# Patient Record
Sex: Female | Born: 1935
Health system: Southern US, Community
[De-identification: ages and names within clinical notes are randomized; demographics above are authoritative.]

## PROBLEM LIST (undated history)

## (undated) DIAGNOSIS — E785 Hyperlipidemia, unspecified: Secondary | ICD-10-CM

## (undated) DIAGNOSIS — H409 Unspecified glaucoma: Secondary | ICD-10-CM

## (undated) DIAGNOSIS — E119 Type 2 diabetes mellitus without complications: Secondary | ICD-10-CM

## (undated) DIAGNOSIS — I10 Essential (primary) hypertension: Secondary | ICD-10-CM

## (undated) HISTORY — DX: Type 2 diabetes mellitus without complications: E11.9

## (undated) HISTORY — DX: Unspecified glaucoma: H40.9

## (undated) HISTORY — PX: ABDOMINAL HYSTERECTOMY: SUR658

## (undated) HISTORY — PX: CYST EXCISION: SHX5701

## (undated) HISTORY — DX: Essential (primary) hypertension: I10

## (undated) HISTORY — DX: Hyperlipidemia, unspecified: E78.5

---

## 2014-01-03 DIAGNOSIS — E119 Type 2 diabetes mellitus without complications: Secondary | ICD-10-CM | POA: Diagnosis not present

## 2014-01-03 DIAGNOSIS — I2510 Atherosclerotic heart disease of native coronary artery without angina pectoris: Secondary | ICD-10-CM | POA: Diagnosis not present

## 2014-01-03 DIAGNOSIS — I10 Essential (primary) hypertension: Secondary | ICD-10-CM | POA: Diagnosis not present

## 2014-01-03 DIAGNOSIS — E785 Hyperlipidemia, unspecified: Secondary | ICD-10-CM | POA: Diagnosis not present

## 2014-03-14 DIAGNOSIS — H409 Unspecified glaucoma: Secondary | ICD-10-CM | POA: Diagnosis not present

## 2014-03-14 DIAGNOSIS — Z961 Presence of intraocular lens: Secondary | ICD-10-CM | POA: Diagnosis not present

## 2014-03-14 DIAGNOSIS — H4010X0 Unspecified open-angle glaucoma, stage unspecified: Secondary | ICD-10-CM | POA: Diagnosis not present

## 2014-04-04 DIAGNOSIS — H409 Unspecified glaucoma: Secondary | ICD-10-CM | POA: Diagnosis not present

## 2014-04-04 DIAGNOSIS — H4010X0 Unspecified open-angle glaucoma, stage unspecified: Secondary | ICD-10-CM | POA: Diagnosis not present

## 2014-05-24 DIAGNOSIS — E785 Hyperlipidemia, unspecified: Secondary | ICD-10-CM | POA: Diagnosis not present

## 2014-05-24 DIAGNOSIS — I2510 Atherosclerotic heart disease of native coronary artery without angina pectoris: Secondary | ICD-10-CM | POA: Diagnosis not present

## 2014-05-24 DIAGNOSIS — E119 Type 2 diabetes mellitus without complications: Secondary | ICD-10-CM | POA: Diagnosis not present

## 2014-05-24 DIAGNOSIS — Z23 Encounter for immunization: Secondary | ICD-10-CM | POA: Diagnosis not present

## 2014-05-24 DIAGNOSIS — I10 Essential (primary) hypertension: Secondary | ICD-10-CM | POA: Diagnosis not present

## 2014-06-19 DIAGNOSIS — H409 Unspecified glaucoma: Secondary | ICD-10-CM | POA: Diagnosis not present

## 2014-06-19 DIAGNOSIS — H4010X0 Unspecified open-angle glaucoma, stage unspecified: Secondary | ICD-10-CM | POA: Diagnosis not present

## 2014-06-19 DIAGNOSIS — H26499 Other secondary cataract, unspecified eye: Secondary | ICD-10-CM | POA: Diagnosis not present

## 2014-06-28 DIAGNOSIS — H4010X0 Unspecified open-angle glaucoma, stage unspecified: Secondary | ICD-10-CM | POA: Diagnosis not present

## 2014-06-28 DIAGNOSIS — H409 Unspecified glaucoma: Secondary | ICD-10-CM | POA: Diagnosis not present

## 2014-06-28 DIAGNOSIS — H26499 Other secondary cataract, unspecified eye: Secondary | ICD-10-CM | POA: Diagnosis not present

## 2014-09-12 DIAGNOSIS — I10 Essential (primary) hypertension: Secondary | ICD-10-CM | POA: Diagnosis not present

## 2014-09-12 DIAGNOSIS — E119 Type 2 diabetes mellitus without complications: Secondary | ICD-10-CM | POA: Diagnosis not present

## 2014-09-12 DIAGNOSIS — Z1212 Encounter for screening for malignant neoplasm of rectum: Secondary | ICD-10-CM | POA: Diagnosis not present

## 2014-09-12 DIAGNOSIS — Z23 Encounter for immunization: Secondary | ICD-10-CM | POA: Diagnosis not present

## 2014-09-12 DIAGNOSIS — E785 Hyperlipidemia, unspecified: Secondary | ICD-10-CM | POA: Diagnosis not present

## 2015-08-22 DIAGNOSIS — J209 Acute bronchitis, unspecified: Secondary | ICD-10-CM | POA: Diagnosis not present

## 2015-10-07 DIAGNOSIS — Z23 Encounter for immunization: Secondary | ICD-10-CM | POA: Diagnosis not present

## 2015-10-13 ENCOUNTER — Encounter (HOSPITAL_COMMUNITY): Payer: Self-pay | Admitting: Emergency Medicine

## 2015-10-13 ENCOUNTER — Emergency Department (INDEPENDENT_AMBULATORY_CARE_PROVIDER_SITE_OTHER)
Admission: EM | Admit: 2015-10-13 | Discharge: 2015-10-13 | Disposition: A | Discharge status: Home / Self Care | Payer: Medicare Other | Source: Home / Self Care | Attending: Family Medicine | Admitting: Family Medicine

## 2015-10-13 DIAGNOSIS — I10 Essential (primary) hypertension: Secondary | ICD-10-CM

## 2015-10-13 MED ORDER — ROSUVASTATIN CALCIUM 20 MG PO TABS: 20.0000 mg | ORAL_TABLET | Freq: Every day | ORAL | Status: DC

## 2015-10-13 MED ORDER — METOPROLOL SUCCINATE ER 100 MG PO TB24: 100.0000 mg | ORAL_TABLET | Freq: Every day | ORAL | Status: DC

## 2015-10-13 MED ORDER — LOSARTAN POTASSIUM 100 MG PO TABS: 100.0000 mg | ORAL_TABLET | Freq: Every day | ORAL | Status: DC

## 2015-10-13 MED ORDER — AMLODIPINE BESYLATE 10 MG PO TABS: 10.0000 mg | ORAL_TABLET | Freq: Every day | ORAL | Status: DC

## 2015-10-13 NOTE — ED Provider Notes (Signed)
CSN: [id number]     Arrival date & time 10/13/15  1444 History   First MD Initiated Contact with Patient 10/13/15 1618     Chief Complaint  Patient presents with  . Medication Refill   (Consider location/radiation/quality/duration/timing/severity/associated sxs/prior Treatment) The history is provided by the patient. No language interpreter was used.  Patient presents for refills of her usual maintenance medications for high blood pressure and hypercholesterolemia.  She denies any complaints.  Specifically, she denies chest pain, cough, fever, shortness of breath, or pain. She has some ankle swelling which is unchanged from her baseline according to her history.   She did receive the flu shot earlier this flu season.   She is establishing with Dr. Marjo Bicker in early January, moving to the area from [place] near [place], [place].   History reviewed. No pertinent past medical history. No past surgical history on file. History reviewed. No pertinent family history. Social History  Substance Use Topics  . Smoking status: None  . Smokeless tobacco: None  . Alcohol Use: None   OB History    No data available     Review of Systems  Constitutional: Negative for fever, chills, diaphoresis, activity change, appetite change and fatigue.  Respiratory: Negative for cough, chest tightness and shortness of breath.   Cardiovascular: Negative for chest pain.    Allergies  Penicillins  Home Medications   Prior to Admission medications   Medication Sig Start Date End Date Taking? Authorizing Provider  amLODipine (NORVASC) 10 MG tablet Take 1 tablet (10 mg total) by mouth daily. 10/13/15   Willeen Niece, MD  losartan (COZAAR) 100 MG tablet Take 1 tablet (100 mg total) by mouth daily. 10/13/15   Willeen Niece, MD  metoprolol succinate (TOPROL-XL) 100 MG 24 hr tablet Take 1 tablet (100 mg total) by mouth daily. Take with or immediately following a meal. 10/13/15   Willeen Niece, MD   rosuvastatin (CRESTOR) 20 MG tablet Take 1 tablet (20 mg total) by mouth daily. 10/13/15   Willeen Niece, MD   Meds Ordered and Administered this Visit  Medications - No data to display  BP 157/79 mmHg  Pulse 70  Temp(Src) 98.6 F (37 C) (Oral)  SpO2 96% No data found.   Physical Exam  ED Course  Procedures (including critical care time)  Labs Review Labs Reviewed - No data to display  Imaging Review No results found.   Visual Acuity Review  Right Eye Distance:   Left Eye Distance:   Bilateral Distance:    Right Eye Near:   Left Eye Near:    Bilateral Near:         MDM   1. Hypertension, essential    Essential hypertension, controlled. For refills of medications. Has appointment to establish with new primary doctor on the first week of January 2017.     Willeen Niece, MD 10/13/15 [phone]

## 2015-10-13 NOTE — Discharge Instructions (Signed)
It was a pleasure to meet you today.    I am prescribing your usual medications, with one month of additional refills, to get you to your new doctor's appointment.

## 2015-10-13 NOTE — ED Notes (Signed)
The patient presented to the [facility] with family for multiple medication refills. The patient stated that she was not able to gain a PCP until January 2017.

## 2015-11-13 ENCOUNTER — Other Ambulatory Visit: Payer: Self-pay | Admitting: Internal Medicine

## 2015-11-13 ENCOUNTER — Other Ambulatory Visit: Payer: Medicare Other | Admitting: Internal Medicine

## 2015-11-13 DIAGNOSIS — M8580 Other specified disorders of bone density and structure, unspecified site: Secondary | ICD-10-CM

## 2015-11-13 DIAGNOSIS — R7309 Other abnormal glucose: Secondary | ICD-10-CM | POA: Diagnosis not present

## 2015-11-13 DIAGNOSIS — E785 Hyperlipidemia, unspecified: Secondary | ICD-10-CM

## 2015-11-13 DIAGNOSIS — I10 Essential (primary) hypertension: Secondary | ICD-10-CM

## 2015-11-13 DIAGNOSIS — Z1321 Encounter for screening for nutritional disorder: Secondary | ICD-10-CM

## 2015-11-13 LAB — COMPREHENSIVE METABOLIC PANEL
ALT: 24 U/L (ref 6–29)
AST: 20 U/L (ref 10–35)
Albumin: 4.2 g/dL (ref 3.6–5.1)
Alkaline Phosphatase: 80 U/L (ref 33–130)
BUN: 8 mg/dL (ref 7–25)
CALCIUM: 9.3 mg/dL (ref 8.6–10.4)
CO2: 28 mmol/L (ref 20–31)
Chloride: 101 mmol/L (ref 98–110)
Creat: 0.63 mg/dL (ref 0.60–0.88)
GLUCOSE: 122 mg/dL — AB (ref 65–99)
POTASSIUM: 4.4 mmol/L (ref 3.5–5.3)
Sodium: 137 mmol/L (ref 135–146)
Total Bilirubin: 0.9 mg/dL (ref 0.2–1.2)
Total Protein: 6.6 g/dL (ref 6.1–8.1)

## 2015-11-13 LAB — CBC WITH DIFFERENTIAL/PLATELET
Basophils Absolute: 0.1 10*3/uL (ref 0.0–0.1)
Basophils Relative: 1 % (ref 0–1)
Eosinophils Absolute: 0.2 10*3/uL (ref 0.0–0.7)
Eosinophils Relative: 4 % (ref 0–5)
HEMATOCRIT: 37.1 % (ref 36.0–46.0)
HEMOGLOBIN: 12.3 g/dL (ref 12.0–15.0)
LYMPHS ABS: 1.4 10*3/uL (ref 0.7–4.0)
LYMPHS PCT: 28 % (ref 12–46)
MCH: 30.1 pg (ref 26.0–34.0)
MCHC: 33.2 g/dL (ref 30.0–36.0)
MCV: 90.9 fL (ref 78.0–100.0)
MONO ABS: 0.4 10*3/uL (ref 0.1–1.0)
MPV: 8.4 fL — ABNORMAL LOW (ref 8.6–12.4)
Monocytes Relative: 8 % (ref 3–12)
NEUTROS ABS: 3 10*3/uL (ref 1.7–7.7)
Neutrophils Relative %: 59 % (ref 43–77)
Platelets: 260 10*3/uL (ref 150–400)
RBC: 4.08 MIL/uL (ref 3.87–5.11)
RDW: 12.8 % (ref 11.5–15.5)
WBC: 5.1 10*3/uL (ref 4.0–10.5)

## 2015-11-13 LAB — LIPID PANEL
CHOL/HDL RATIO: 2.8 ratio (ref <=5.0)
CHOLESTEROL: 135 mg/dL (ref 125–200)
HDL: 48 mg/dL (ref >=46)
LDL Cholesterol: 59 mg/dL (ref <130)
TRIGLYCERIDES: 140 mg/dL (ref <150)
VLDL: 28 mg/dL (ref <30)

## 2015-11-13 LAB — TSH: TSH: 0.957 u[IU]/mL (ref 0.350–4.500)

## 2015-11-13 NOTE — Addendum Note (Signed)
Addended by: Despina Hidden on: 11/13/2015 10:21 AM   Modules accepted: Orders

## 2015-11-15 ENCOUNTER — Encounter: Payer: Self-pay | Admitting: Internal Medicine

## 2015-11-15 ENCOUNTER — Other Ambulatory Visit: Payer: Self-pay | Admitting: Internal Medicine

## 2015-11-15 ENCOUNTER — Ambulatory Visit (INDEPENDENT_AMBULATORY_CARE_PROVIDER_SITE_OTHER): Payer: Medicare Other | Admitting: Internal Medicine

## 2015-11-15 VITALS — BP 136/78 | HR 87 | Temp 97.9°F | Resp 20 | Ht 59.5 in | Wt 136.0 lb

## 2015-11-15 DIAGNOSIS — I10 Essential (primary) hypertension: Secondary | ICD-10-CM | POA: Diagnosis not present

## 2015-11-15 DIAGNOSIS — Z0000 Encounter for general adult medical examination without abnormal findings: Secondary | ICD-10-CM

## 2015-11-15 DIAGNOSIS — E119 Type 2 diabetes mellitus without complications: Secondary | ICD-10-CM | POA: Diagnosis not present

## 2015-11-15 DIAGNOSIS — Z8673 Personal history of transient ischemic attack (TIA), and cerebral infarction without residual deficits: Secondary | ICD-10-CM

## 2015-11-15 DIAGNOSIS — E118 Type 2 diabetes mellitus with unspecified complications: Secondary | ICD-10-CM | POA: Diagnosis not present

## 2015-11-15 DIAGNOSIS — H409 Unspecified glaucoma: Secondary | ICD-10-CM | POA: Diagnosis not present

## 2015-11-15 DIAGNOSIS — E785 Hyperlipidemia, unspecified: Secondary | ICD-10-CM | POA: Diagnosis not present

## 2015-11-15 DIAGNOSIS — Z1231 Encounter for screening mammogram for malignant neoplasm of breast: Secondary | ICD-10-CM

## 2015-11-15 LAB — POCT URINALYSIS DIPSTICK
Bilirubin, UA: NEGATIVE
Blood, UA: NEGATIVE
Glucose, UA: NEGATIVE
Ketones, UA: NEGATIVE
Leukocytes, UA: NEGATIVE
Nitrite, UA: NEGATIVE
Protein, UA: NEGATIVE
Spec Grav, UA: 1.01
Urobilinogen, UA: 0.2
pH, UA: 6.5

## 2015-11-16 LAB — HEMOGLOBIN A1C
HEMOGLOBIN A1C: 5.5 % (ref <5.7)
MEAN PLASMA GLUCOSE: 111 mg/dL (ref <117)

## 2015-11-16 LAB — MICROALBUMIN, URINE: MICROALB UR: 1.3 mg/dL

## 2015-11-21 ENCOUNTER — Telehealth: Payer: Self-pay

## 2015-11-21 NOTE — Telephone Encounter (Signed)
Patient referred for diagnostic mammogram: scheduled for 12/03/15 @ 10:50

## 2015-12-03 ENCOUNTER — Ambulatory Visit
Admission: RE | Admit: 2015-12-03 | Discharge: 2015-12-03 | Disposition: A | Discharge status: Home / Self Care | Payer: Medicare Other | Source: Ambulatory Visit | Attending: Internal Medicine | Admitting: Internal Medicine

## 2015-12-03 DIAGNOSIS — Z1231 Encounter for screening mammogram for malignant neoplasm of breast: Secondary | ICD-10-CM | POA: Diagnosis not present

## 2015-12-06 ENCOUNTER — Other Ambulatory Visit: Payer: Self-pay | Admitting: Family Medicine

## 2015-12-06 NOTE — Telephone Encounter (Signed)
Refill x 6 months 

## 2015-12-09 DIAGNOSIS — E785 Hyperlipidemia, unspecified: Secondary | ICD-10-CM | POA: Insufficient documentation

## 2015-12-09 DIAGNOSIS — E119 Type 2 diabetes mellitus without complications: Secondary | ICD-10-CM | POA: Insufficient documentation

## 2015-12-09 DIAGNOSIS — H409 Unspecified glaucoma: Secondary | ICD-10-CM | POA: Insufficient documentation

## 2015-12-09 DIAGNOSIS — I10 Essential (primary) hypertension: Secondary | ICD-10-CM | POA: Insufficient documentation

## 2015-12-09 NOTE — Progress Notes (Signed)
Subjective:    Patient ID: Amber Chang, female    DOB: 08/14/1927, 82 y.o.   MRN: [id number]  HPI First visit for this 82 year old Black Female, mother of [name] who has moved here recently to reside with [name]. Patient previously saw Dr. Ladell Heads in [place], [place] for primary care. Mrs. Luttrell previously resided in [place], [place] the [place] in [place].  Medical issues include essential hypertension, controlled height 2 diabetes mellitus, hyperlipidemia. History of allergic rhinitis treated with Zyrtec.   Patient had stroke in the remote past. It is not clear exactly from the records when that occurred.  Hysterectomy at age 46  Implant injury all due to left TMJ problem in the remote past.  She is allergic penicillin causes hives  Had been maintained on 81 mg aspirin and Plavix 75 mg daily. Also has been  on metformin, losartan, Crestor, Norvasc, metoprolol.  Patient had tetanus immunization update 2012. Had Zostavax vaccine 10/29/2011. Pneumococcal 13 vaccine 05/21/2014.  Colonoscopy done 2001.  History of glaucoma and has 2 eyedrops that she uses for that.  Social history: She is retired from the [employer]. Husband was a truck driver but he passed away. Patient worked as a Programmer, applications. Hobbies include sewing. Does not smoke or consume alcohol.  Family history: Father died at age 50 with history of stroke and pneumonia. Mother died at age 55 of a stroke. 10 brothers all are deceased. 2 died of strokes, 3 died of cancer one died of brain cancer. 2 died of GI cancers. 5 brothers with history of stroke. All brothers had hypertension. One daughter died at age 33 in an auto accident.  I'm not sure exactly what her stroke occurred. There is a report of carotid Dopplers from February 2014 indicated she had episode of syncope. She had minimal calcified plaque in the proximal left ICA. Right carotid  was normal. She had a 2-D echocardiogram showing mild diastolic dysfunction. EKG done February 2014 was within normal limits.    Review of Systems     Objective:   Physical Exam  Constitutional: She is oriented to person, place, and time. She appears well-developed and well-nourished. No distress.  HENT:  Head: Normocephalic and atraumatic.  Right Ear: External ear normal.  Left Ear: External ear normal.  Mouth/Throat: Oropharynx is clear and moist. No oropharyngeal exudate.  Eyes: Conjunctivae and EOM are normal. Pupils are equal, round, and reactive to light. Right eye exhibits no discharge. Left eye exhibits no discharge. No scleral icterus.  Neck: Neck supple. No JVD present. No thyromegaly present.  Cardiovascular: Normal rate, regular rhythm, normal heart sounds and intact distal pulses.   No murmur heard. Pulmonary/Chest: Effort normal and breath sounds normal. No respiratory distress. She has no wheezes. She has no rales.  May have abnormality in left breast. Recommend mammogram.  Abdominal: Soft. Bowel sounds are normal. She exhibits no distension and no mass. There is no tenderness. There is no rebound and no guarding.  Genitourinary:  Deferred due to age 82  Musculoskeletal: She exhibits no edema.  Lymphadenopathy:    She has no cervical adenopathy.  Neurological: She is alert and oriented to person, place, and time. She has normal reflexes. No cranial nerve deficit. Coordination normal.  Skin: Skin is warm and dry. She is not diaphoretic.  Psychiatric: She has a normal mood and affect. Her behavior is normal. Judgment and thought content normal.  Vitals reviewed.  Assessment & Plan:  Normal health maintenance exam  Remote history of stroke  Controlled type 2 diabetes mellitus  Essential hypertension  Hyperlipidemia  Plan: Return in 6 months or as needed. Lab work is within normal limits.  Subjective:   Patient presents for Medicare Annual/Subsequent  preventive examination.  Review Past Medical/Family/Social:   Risk Factors  Current exercise habits:  Dietary issues discussed:   Cardiac risk factors:  Depression Screen  (Note: if answer to either of the following is "Yes", a more complete depression screening is indicated)   Over the past two weeks, have you felt down, depressed or hopeless? No  Over the past two weeks, have you felt little interest or pleasure in doing things? No Have you lost interest or pleasure in daily life? No Do you often feel hopeless? No Do you cry easily over simple problems? No   Activities of Daily Living  In your present state of health, do you have any difficulty performing the following activities?:   Driving? Does not drive due to poor I saw Managing money? No  Feeding yourself? No  Getting from bed to chair? No  Climbing a flight of stairs? No  Preparing food and eating?: No  Bathing or showering? No  Getting dressed: No  Getting to the toilet? No  Using the toilet:No  Moving around from place to place: No  In the past year have you fallen or had a near fall?: Yes Are you sexually active? No  Do you have more than one partner? No   Hearing Difficulties: No  Do you often ask people to speak up or repeat themselves? No  Do you experience ringing or noises in your ears? No  Do you have difficulty understanding soft or whispered voices? No  Do you feel that you have a problem with memory? Sometimes Do you often misplace items? No    Home Safety:  Do you have a smoke alarm at your residence? Yes Do you have grab bars in the bathroom? No Do you have throw rugs in your house? No   Cognitive Testing  Alert? Yes Normal Appearance?Yes  Oriented to person? Yes Place? Yes  Time? Yes  Recall of three objects? Yes  Can perform simple calculations? Yes  Displays appropriate judgment?Yes  Can read the correct time from a watch face?Yes   List the Names of Other Physician/Practitioners  you currently use:  See referral list for the physicians patient is currently seeing.  No specialists   Review of Systems: See above   Objective:     General appearance: Appears stated age , thin and very pleasant Head: Normocephalic, without obvious abnormality, atraumatic  Eyes: conj clear, EOMi PEERLA  Ears: normal TM's and external ear canals both ears  Nose: Nares normal. Septum midline. Mucosa normal. No drainage or sinus tenderness.  Throat: lips, mucosa, and tongue normal; teeth and gums normal  Neck: no adenopathy, no carotid bruit, no JVD, supple, symmetrical, trachea midline and thyroid not enlarged, symmetric, no tenderness/mass/nodules  No CVA tenderness.  Lungs: clear to auscultation bilaterally  Heart: regular rate and rhythm, S1, S2 normal, no murmur, click, rub or gallop  Abdomen: soft, non-tender; bowel sounds normal; no masses, no organomegaly  Musculoskeletal: ROM normal in all joints, no crepitus, no deformity, Normal muscle strengthen. Back  is symmetric, no curvature. Skin: Skin color, texture, turgor normal. No rashes or lesions  Lymph nodes: Cervical, supraclavicular, and axillary nodes normal.  Neurologic: CN 2 -12 Normal, Normal symmetric  reflexes. Normal coordination and gait  Psych: Alert & Oriented x 3, Mood appear stable.    Assessment:    Annual wellness medicare exam   Plan:    During the course of the visit the patient was educated and counseled about appropriate screening and preventive services including:  Annual flu vaccine  Recommend mammogram because of possible abnormality left breast       Patient Instructions (the written plan) was given to the patient.  Medicare Attestation  I have personally reviewed:  The patient's medical and social history  Their use of alcohol, tobacco or illicit drugs  Their current medications and supplements  The patient's functional ability including ADLs,fall risks, home safety risks, cognitive,  and hearing and visual impairment  Diet and physical activities  Evidence for depression or mood disorders  The patient's weight, height, BMI, and visual acuity have been recorded in the chart. I have made referrals, counseling, and provided education to the patient based on review of the above and I have provided the patient with a written personalized care plan for preventive services.

## 2015-12-09 NOTE — Patient Instructions (Signed)
It was a pleasure to see you today.  Continue same medications and return in 6 months. 

## 2016-04-09 ENCOUNTER — Other Ambulatory Visit: Payer: Self-pay | Admitting: Internal Medicine

## 2016-04-09 NOTE — Telephone Encounter (Signed)
Has appointment in July. Refill for 90 days

## 2016-04-15 ENCOUNTER — Other Ambulatory Visit: Payer: Self-pay

## 2016-04-15 MED ORDER — CLOPIDOGREL BISULFATE 75 MG PO TABS: 75.0000 mg | ORAL_TABLET | Freq: Once | ORAL | Status: DC

## 2016-05-12 ENCOUNTER — Other Ambulatory Visit: Payer: Medicare Other | Admitting: Internal Medicine

## 2016-05-15 ENCOUNTER — Ambulatory Visit: Payer: Medicare Other | Admitting: Internal Medicine

## 2016-05-29 ENCOUNTER — Other Ambulatory Visit: Payer: Self-pay | Admitting: Internal Medicine

## 2016-05-29 ENCOUNTER — Other Ambulatory Visit: Payer: Medicare Other | Admitting: Internal Medicine

## 2016-05-29 DIAGNOSIS — Z79899 Other long term (current) drug therapy: Secondary | ICD-10-CM

## 2016-05-29 DIAGNOSIS — I10 Essential (primary) hypertension: Secondary | ICD-10-CM | POA: Diagnosis not present

## 2016-05-29 DIAGNOSIS — E785 Hyperlipidemia, unspecified: Secondary | ICD-10-CM

## 2016-05-29 DIAGNOSIS — E119 Type 2 diabetes mellitus without complications: Secondary | ICD-10-CM

## 2016-05-29 LAB — LIPID PANEL
CHOL/HDL RATIO: 2.4 ratio (ref <=5.0)
CHOLESTEROL: 109 mg/dL — AB (ref 125–200)
HDL: 46 mg/dL (ref >=46)
LDL CALC: 38 mg/dL (ref <130)
TRIGLYCERIDES: 126 mg/dL (ref <150)
VLDL: 25 mg/dL (ref <30)

## 2016-05-29 LAB — HEPATIC FUNCTION PANEL
ALT: 22 U/L (ref 6–29)
AST: 20 U/L (ref 10–35)
Albumin: 3.9 g/dL (ref 3.6–5.1)
Alkaline Phosphatase: 82 U/L (ref 33–130)
BILIRUBIN DIRECT: 0.2 mg/dL (ref <=0.2)
Indirect Bilirubin: 0.7 mg/dL (ref 0.2–1.2)
TOTAL PROTEIN: 6.4 g/dL (ref 6.1–8.1)
Total Bilirubin: 0.9 mg/dL (ref 0.2–1.2)

## 2016-06-03 ENCOUNTER — Encounter: Payer: Self-pay | Admitting: Internal Medicine

## 2016-06-03 ENCOUNTER — Ambulatory Visit (INDEPENDENT_AMBULATORY_CARE_PROVIDER_SITE_OTHER): Payer: Medicare Other | Admitting: Internal Medicine

## 2016-06-03 VITALS — BP 118/72 | HR 88 | Temp 97.8°F | Ht 59.5 in | Wt 146.0 lb

## 2016-06-03 DIAGNOSIS — E785 Hyperlipidemia, unspecified: Secondary | ICD-10-CM | POA: Diagnosis not present

## 2016-06-03 DIAGNOSIS — E119 Type 2 diabetes mellitus without complications: Secondary | ICD-10-CM | POA: Diagnosis not present

## 2016-06-03 DIAGNOSIS — Z8673 Personal history of transient ischemic attack (TIA), and cerebral infarction without residual deficits: Secondary | ICD-10-CM | POA: Diagnosis not present

## 2016-06-03 DIAGNOSIS — H409 Unspecified glaucoma: Secondary | ICD-10-CM | POA: Diagnosis not present

## 2016-06-03 DIAGNOSIS — N390 Urinary tract infection, site not specified: Secondary | ICD-10-CM

## 2016-06-03 DIAGNOSIS — I10 Essential (primary) hypertension: Secondary | ICD-10-CM | POA: Diagnosis not present

## 2016-06-03 DIAGNOSIS — E118 Type 2 diabetes mellitus with unspecified complications: Secondary | ICD-10-CM

## 2016-06-03 LAB — POCT URINALYSIS DIPSTICK
GLUCOSE UA: NEGATIVE
Ketones, UA: NEGATIVE
Nitrite, UA: NEGATIVE
PROTEIN UA: NEGATIVE
SPEC GRAV UA: 1.01
UROBILINOGEN UA: 0.2
pH, UA: 6

## 2016-06-03 LAB — HEMOGLOBIN A1C
HEMOGLOBIN A1C: 6 % — AB (ref <5.7)
Mean Plasma Glucose: 126 mg/dL

## 2016-06-03 NOTE — Addendum Note (Signed)
Addended by: Milta Deiters on: 06/03/2016 02:49 PM   Modules accepted: Orders

## 2016-06-03 NOTE — Patient Instructions (Signed)
It was a pleasure to see you today. Hemoglobin A1c is pending. Continue same medications and return January 2018 for physical exam.

## 2016-06-03 NOTE — Progress Notes (Signed)
   Subjective:    Patient ID: Amber Chang, female    DOB: 10/04/1927, 82 y.o.   MRN: [id number]  HPI Patient was seen here for first time in January 2017. She's had a mammogram that was normal earlier this year. She has a history of essential hypertension, hyperlipidemia, controlled type 2 diabetes mellitus treated with metformin, glaucoma. Needs to see ophthalmologist. Hanley Seamen her names of several in the area.  No new complaints. Has some dependent edema particularly with hot weather. No shortness of breath or chest pain.    Review of Systems see above     Objective:   Physical Exam  Constitutional: She appears well-developed and well-nourished. No distress.  Neck: No JVD present. No thyromegaly present.  Cardiovascular: Normal rate, regular rhythm and normal heart sounds.   Pulmonary/Chest: Effort normal and breath sounds normal. No respiratory distress. She has no wheezes.  Musculoskeletal:  She has some lower extremity edema that is nonpitting  Lymphadenopathy:    She has no cervical adenopathy.  Skin: Skin is warm. She is not diaphoretic.  Psychiatric: She has a normal mood and affect. Her behavior is normal. Judgment and thought content normal.  Vitals reviewed.         Assessment & Plan:  Essential hypertension-stable on current regimen  Hyperlipidemia-lipid panel liver functions normal on statin therapy  Controlled type 2 diabetes mellitus treated with metformin. Hemoglobin A1c pending  Glaucoma-to see ophthalmology in the near future  Dependent edema-stable and related to hot weather  Plan: Return January 2018 for physical examination and fasting lab work. Continue same medications.

## 2016-06-03 NOTE — Addendum Note (Signed)
Addended by: Milta Deiters on: 06/03/2016 11:58 AM   Modules accepted: Orders

## 2016-06-04 ENCOUNTER — Telehealth: Payer: Self-pay

## 2016-06-04 LAB — URINE CULTURE

## 2016-06-04 LAB — MICROALBUMIN / CREATININE URINE RATIO
Creatinine, Urine: 79 mg/dL (ref 20–320)
MICROALB/CREAT RATIO: 19 ug/mg{creat} (ref <30)
Microalb, Ur: 1.5 mg/dL

## 2016-06-04 NOTE — Telephone Encounter (Signed)
-----   Message from Elby Showers, MD sent at 06/03/2016  2:38 PM EDT ----- Please call patient and tell her hemoglobin A1c is 6.0% and stable

## 2016-06-04 NOTE — Telephone Encounter (Signed)
Called patient. Gave lab results. Patient verbalized understanding.  

## 2016-07-17 DIAGNOSIS — Z23 Encounter for immunization: Secondary | ICD-10-CM | POA: Diagnosis not present

## 2016-08-05 DIAGNOSIS — H524 Presbyopia: Secondary | ICD-10-CM | POA: Diagnosis not present

## 2016-08-05 DIAGNOSIS — H401123 Primary open-angle glaucoma, left eye, severe stage: Secondary | ICD-10-CM | POA: Diagnosis not present

## 2016-08-05 DIAGNOSIS — Z961 Presence of intraocular lens: Secondary | ICD-10-CM | POA: Diagnosis not present

## 2016-08-05 DIAGNOSIS — H401113 Primary open-angle glaucoma, right eye, severe stage: Secondary | ICD-10-CM | POA: Diagnosis not present

## 2016-08-06 ENCOUNTER — Other Ambulatory Visit: Payer: Self-pay | Admitting: Internal Medicine

## 2016-08-11 ENCOUNTER — Other Ambulatory Visit: Payer: Self-pay | Admitting: Internal Medicine

## 2016-09-22 ENCOUNTER — Other Ambulatory Visit: Payer: Self-pay | Admitting: Internal Medicine

## 2016-09-22 MED ORDER — METFORMIN HCL 500 MG PO TABS: 1000.0000 mg | ORAL_TABLET | Freq: Every day | ORAL | 1 refills | Status: DC

## 2016-10-27 DIAGNOSIS — H401113 Primary open-angle glaucoma, right eye, severe stage: Secondary | ICD-10-CM | POA: Diagnosis not present

## 2016-11-13 ENCOUNTER — Other Ambulatory Visit: Payer: Medicare Other | Admitting: Internal Medicine

## 2016-11-18 ENCOUNTER — Encounter: Payer: Medicare Other | Admitting: Internal Medicine

## 2016-12-01 ENCOUNTER — Other Ambulatory Visit: Payer: Medicare Other | Admitting: Internal Medicine

## 2016-12-04 ENCOUNTER — Other Ambulatory Visit: Payer: Medicare Other | Admitting: Internal Medicine

## 2016-12-04 DIAGNOSIS — Z0000 Encounter for general adult medical examination without abnormal findings: Secondary | ICD-10-CM | POA: Diagnosis not present

## 2016-12-04 DIAGNOSIS — E785 Hyperlipidemia, unspecified: Secondary | ICD-10-CM | POA: Diagnosis not present

## 2016-12-04 DIAGNOSIS — Z1321 Encounter for screening for nutritional disorder: Secondary | ICD-10-CM | POA: Diagnosis not present

## 2016-12-04 DIAGNOSIS — Z1329 Encounter for screening for other suspected endocrine disorder: Secondary | ICD-10-CM

## 2016-12-04 DIAGNOSIS — E2839 Other primary ovarian failure: Secondary | ICD-10-CM | POA: Diagnosis not present

## 2016-12-04 DIAGNOSIS — E119 Type 2 diabetes mellitus without complications: Secondary | ICD-10-CM | POA: Diagnosis not present

## 2016-12-04 DIAGNOSIS — M8580 Other specified disorders of bone density and structure, unspecified site: Secondary | ICD-10-CM | POA: Diagnosis not present

## 2016-12-04 DIAGNOSIS — I10 Essential (primary) hypertension: Secondary | ICD-10-CM

## 2016-12-04 LAB — COMPREHENSIVE METABOLIC PANEL
ALK PHOS: 78 U/L (ref 33–130)
ALT: 24 U/L (ref 6–29)
AST: 22 U/L (ref 10–35)
Albumin: 4.1 g/dL (ref 3.6–5.1)
BILIRUBIN TOTAL: 0.8 mg/dL (ref 0.2–1.2)
BUN: 6 mg/dL — ABNORMAL LOW (ref 7–25)
CALCIUM: 9.4 mg/dL (ref 8.6–10.4)
CO2: 23 mmol/L (ref 20–31)
CREATININE: 0.63 mg/dL (ref 0.60–0.88)
Chloride: 103 mmol/L (ref 98–110)
GLUCOSE: 122 mg/dL — AB (ref 65–99)
Potassium: 4.2 mmol/L (ref 3.5–5.3)
SODIUM: 139 mmol/L (ref 135–146)
Total Protein: 6.7 g/dL (ref 6.1–8.1)

## 2016-12-04 LAB — CBC WITH DIFFERENTIAL/PLATELET
BASOS PCT: 0 %
Basophils Absolute: 0 cells/uL (ref 0–200)
EOS PCT: 4 %
Eosinophils Absolute: 208 cells/uL (ref 15–500)
HEMATOCRIT: 38.9 % (ref 35.0–45.0)
HEMOGLOBIN: 12.4 g/dL (ref 11.7–15.5)
LYMPHS ABS: 1560 {cells}/uL (ref 850–3900)
Lymphocytes Relative: 30 %
MCH: 29.7 pg (ref 27.0–33.0)
MCHC: 31.9 g/dL — ABNORMAL LOW (ref 32.0–36.0)
MCV: 93.3 fL (ref 80.0–100.0)
MONO ABS: 468 {cells}/uL (ref 200–950)
MPV: 8.4 fL (ref 7.5–12.5)
Monocytes Relative: 9 %
Neutro Abs: 2964 cells/uL (ref 1500–7800)
Neutrophils Relative %: 57 %
Platelets: 267 10*3/uL (ref 140–400)
RBC: 4.17 MIL/uL (ref 3.80–5.10)
RDW: 12.3 % (ref 11.0–15.0)
WBC: 5.2 10*3/uL (ref 3.8–10.8)

## 2016-12-04 LAB — LIPID PANEL
Cholesterol: 135 mg/dL (ref <200)
HDL: 50 mg/dL — AB (ref >50)
LDL CALC: 59 mg/dL (ref <100)
Total CHOL/HDL Ratio: 2.7 Ratio (ref <5.0)
Triglycerides: 129 mg/dL (ref <150)
VLDL: 26 mg/dL (ref <30)

## 2016-12-04 LAB — TSH: TSH: 0.73 m[IU]/L

## 2016-12-05 LAB — MICROALBUMIN / CREATININE URINE RATIO
CREATININE, URINE: 180 mg/dL (ref 20–320)
MICROALB UR: 3.4 mg/dL
MICROALB/CREAT RATIO: 19 ug/mg{creat} (ref <30)

## 2016-12-05 LAB — VITAMIN D 25 HYDROXY (VIT D DEFICIENCY, FRACTURES): Vit D, 25-Hydroxy: 10 ng/mL — ABNORMAL LOW (ref 30–100)

## 2016-12-08 ENCOUNTER — Encounter: Payer: Self-pay | Admitting: Internal Medicine

## 2016-12-08 ENCOUNTER — Ambulatory Visit (INDEPENDENT_AMBULATORY_CARE_PROVIDER_SITE_OTHER): Payer: Medicare Other | Admitting: Internal Medicine

## 2016-12-08 VITALS — BP 122/70 | HR 80 | Temp 98.0°F | Ht 58.5 in | Wt 139.0 lb

## 2016-12-08 DIAGNOSIS — E118 Type 2 diabetes mellitus with unspecified complications: Secondary | ICD-10-CM

## 2016-12-08 DIAGNOSIS — E559 Vitamin D deficiency, unspecified: Secondary | ICD-10-CM

## 2016-12-08 DIAGNOSIS — I10 Essential (primary) hypertension: Secondary | ICD-10-CM

## 2016-12-08 DIAGNOSIS — E784 Other hyperlipidemia: Secondary | ICD-10-CM | POA: Diagnosis not present

## 2016-12-08 DIAGNOSIS — E7849 Other hyperlipidemia: Secondary | ICD-10-CM

## 2016-12-08 DIAGNOSIS — R8290 Unspecified abnormal findings in urine: Secondary | ICD-10-CM

## 2016-12-08 DIAGNOSIS — H409 Unspecified glaucoma: Secondary | ICD-10-CM

## 2016-12-08 DIAGNOSIS — Z0000 Encounter for general adult medical examination without abnormal findings: Secondary | ICD-10-CM | POA: Diagnosis not present

## 2016-12-08 DIAGNOSIS — Z8673 Personal history of transient ischemic attack (TIA), and cerebral infarction without residual deficits: Secondary | ICD-10-CM

## 2016-12-08 LAB — POC URINALSYSI DIPSTICK (AUTOMATED)
Bilirubin, UA: NEGATIVE
Glucose, UA: NEGATIVE
Ketones, UA: NEGATIVE
LEUKOCYTES UA: NEGATIVE
Nitrite, UA: NEGATIVE
PROTEIN UA: NEGATIVE
SPEC GRAV UA: 1.01
Urobilinogen, UA: NEGATIVE
pH, UA: 6.5

## 2016-12-08 LAB — HEMOGLOBIN A1C
Hgb A1c MFr Bld: 5.2 % (ref <5.7)
MEAN PLASMA GLUCOSE: 103 mg/dL

## 2016-12-08 NOTE — Progress Notes (Signed)
Subjective:    Patient ID: Amber Chang, female    DOB: 1927/07/14, 83 y.o.   MRN: [id number]  HPI  83 year old  Black Female for health maintenance exam and evaluation of medical issues   Daughter is Amber Chang who is also a patient here.  Patient first presented here January 2017. Patient moved here to be with her daughter. Patiently previously was seen by Dr. Ladell Heads in [place], [place] for primary care.  Mrs. Hong previously resided in [place], [place] the [place] in [place].  Medical issues including essential hypertension, controlled type 2 diabetes mellitus, hyperlipidemia. History of allergic rhinitis treated with Zyrtec.  Hysterectomy at age 12.  She is allergic penicillin-he causes hives  Patient had a stroke in the remote past. It is not exactly clear from old records when that occurred. She has been maintained on 81 mg of aspirin daily and Plavix 75 mg daily.  Also takes metformin, losartan, Crestor, Norvasc and metoprolol.  History of glaucoma and she has 2 different types of eye drops she uses.  Patient had Zostavax vaccine 2012, tetanus immunization update 2012, pneumococcal 13 vaccine 2015.  Had colonoscopy in 2001.  Social history: She is retired from the [employer]. Husband was a truck driver but he passed away. Patient worked as a Programmer, applications. Hobbies include sewing. She does not smoke or consume alcohol.  In the old records there is report of carotid Dopplers from February 2014 indicating that she had an episode of syncope. She had minimal calcified plaque in the proximal left internal carotid artery. Right carotid was normal. She had a 2-D echocardiogram showing mild diastolic dysfunction. EKG done February 2014 was within normal limits.  Family history: Father died at age 81 with history of stroke and pneumonia. Mother died at age 24 of a stroke. 10 brothers are all deceased. 2  died of strokes. 3 died of cancer. One died of brain cancer. 2 died of GI cancers. 5 brothers with history of stroke. All brothers had hypertension. One daughter died at age 74 in an automobile accident.      Review of Systems  Constitutional: Negative.   HENT: Negative.   Respiratory: Negative.   Cardiovascular: Negative.   Gastrointestinal: Negative.   Genitourinary: Negative.   Musculoskeletal: Negative.   Psychiatric/Behavioral: Negative.        Objective:   Physical Exam  Constitutional: She is oriented to person, place, and time. She appears well-developed and well-nourished. No distress.  HENT:  Head: Normocephalic and atraumatic.  Right Ear: External ear normal.  Left Ear: External ear normal.  Mouth/Throat: Oropharynx is clear and moist. No oropharyngeal exudate.  Eyes: Conjunctivae and EOM are normal. Pupils are equal, round, and reactive to light. Right eye exhibits no discharge. Left eye exhibits no discharge. No scleral icterus.  Neck: Neck supple. No JVD present. No thyromegaly present.  Cardiovascular: Normal rate, regular rhythm, normal heart sounds and intact distal pulses.   No murmur heard. Pulmonary/Chest: Effort normal and breath sounds normal. She has no wheezes.  Breasts normal female  Abdominal: Soft. Bowel sounds are normal. She exhibits no distension and no mass. There is no tenderness. There is no rebound and no guarding.  Genitourinary:  Genitourinary Comments: Deferred  Musculoskeletal: She exhibits no edema.  Neurological: She is alert and oriented to person, place, and time. No cranial nerve deficit. Coordination normal.  Skin: Skin is warm and dry. No rash noted. She  is not diaphoretic.  Psychiatric: She has a normal mood and affect. Her behavior is normal. Judgment and thought content normal.  Vitals reviewed.         Assessment & Plan:  Normal health maintenance exam  Controlled type 2 diabetes mellitus  Hyperlipidemia  History of  stroke  Vitamin D deficiency  Essential hypertension  History of glaucoma  Plan: 2000 units vitamin D 3 daily. Continue same medications. She has an abnormal urinalysis. Culture will be sent. Return in one year or as needed. Daughter request handicap parking permit for patient and this will be provided today.  Subjective:   Patient presents for Medicare Annual/Subsequent preventive examination.  Review Past Medical/Family/Social:   Risk Factors  Current exercise habits: Mostly sedentary Dietary issues discussed: Low fat low carbohydrate  Cardiac risk factors: Family history of stroke, hypertension diabetes  Depression Screen  (Note: if answer to either of the following is "Yes", a more complete depression screening is indicated)   Over the past two weeks, have you felt down, depressed or hopeless? No  Over the past two weeks, have you felt little interest or pleasure in doing things? No Have you lost interest or pleasure in daily life? No Do you often feel hopeless? No Do you cry easily over simple problems? No   Activities of Daily Living  In your present state of health, do you have any difficulty performing the following activities?:   Driving? No-doesn't drive  Managing money? Needs help Feeding yourself? No  Getting from bed to chair? No  Climbing a flight of stairs? Yes Preparing food and eating?: No  Bathing or showering? No  Getting dressed: No  Getting to the toilet? No  Using the toilet:No  Moving around from place to place: No  In the past year have you fallen or had a near fall?:No  Are you sexually active? No  Do you have more than one partner? No   Hearing Difficulties: No  Do you often ask people to speak up or repeat themselves? No  Do you experience ringing or noises in your ears? Yes  Do you have difficulty understanding soft or whispered voices? Yes Do you feel that you have a problem with memory? Yes some Do you often misplace items? Yes  sometimes   Home Safety:  Do you have a smoke alarm at your residence? Yes Do you have grab bars in the bathroom? No Do you have throw rugs in your house? No   Cognitive Testing  Alert? Yes Normal Appearance?Yes  Oriented to person? Yes Place? Yes  Time? Yes  Recall of three objects? Yes  Can perform simple calculations? Yes  Displays appropriate judgment?Yes  Can read the correct time from a watch face?Yes   List the Names of Other Physician/Practitioners you currently use:  See referral list for the physicians patient is currently seeing.     Review of Systems:   Objective:     General appearance: Appears stated age and mildly obese  Head: Normocephalic, without obvious abnormality, atraumatic  Eyes: conj clear, EOMi PEERLA  Ears: normal TM's and external ear canals both ears  Nose: Nares normal. Septum midline. Mucosa normal. No drainage or sinus tenderness.  Throat: lips, mucosa, and tongue normal; teeth and gums normal  Neck: no adenopathy, no carotid bruit, no JVD, supple, symmetrical, trachea midline and thyroid not enlarged, symmetric, no tenderness/mass/nodules  No CVA tenderness.  Lungs: clear to auscultation bilaterally  Breasts: normal appearance, no masses or tenderness Heart:  regular rate and rhythm, S1, S2 normal, no murmur, click, rub or gallop  Abdomen: soft, non-tender; bowel sounds normal; no masses, no organomegaly  Musculoskeletal: ROM normal in all joints, no crepitus, no deformity, Normal muscle strengthen. Back  is symmetric, no curvature. Skin: Skin color, texture, turgor normal. No rashes or lesions  Lymph nodes: Cervical, supraclavicular, and axillary nodes normal.  Neurologic: CN 2 -12 Normal, Normal symmetric reflexes. Normal coordination and gait  Psych: Alert & Oriented x 3, Mood appear stable.    Assessment:    Annual wellness medicare exam   Plan:    During the course of the visit the patient was educated and counseled about  appropriate screening and preventive services including:        Patient Instructions (the written plan) was given to the patient.  Medicare Attestation  I have personally reviewed:  The patient's medical and social history  Their use of alcohol, tobacco or illicit drugs  Their current medications and supplements  The patient's functional ability including ADLs,fall risks, home safety risks, cognitive, and hearing and visual impairment  Diet and physical activities  Evidence for depression or mood disorders  The patient's weight, height, BMI, and visual acuity have been recorded in the chart. I have made referrals, counseling, and provided education to the patient based on review of the above and I have provided the patient with a written personalized care plan for preventive services.

## 2016-12-08 NOTE — Patient Instructions (Signed)
It was a pleasure to see you today. Please take vitamin D 2000 units daily. Return in one year or as needed. Urine culture pending.

## 2016-12-09 ENCOUNTER — Other Ambulatory Visit: Payer: Self-pay | Admitting: Internal Medicine

## 2016-12-09 LAB — URINE CULTURE: Organism ID, Bacteria: NO GROWTH

## 2016-12-18 ENCOUNTER — Other Ambulatory Visit: Payer: Self-pay | Admitting: Internal Medicine

## 2017-01-27 DIAGNOSIS — H401133 Primary open-angle glaucoma, bilateral, severe stage: Secondary | ICD-10-CM | POA: Diagnosis not present

## 2017-02-09 ENCOUNTER — Other Ambulatory Visit: Payer: Self-pay | Admitting: Internal Medicine

## 2017-02-09 DIAGNOSIS — Z1231 Encounter for screening mammogram for malignant neoplasm of breast: Secondary | ICD-10-CM

## 2017-02-13 ENCOUNTER — Ambulatory Visit
Admission: RE | Admit: 2017-02-13 | Discharge: 2017-02-13 | Disposition: A | Discharge status: Home / Self Care | Payer: Medicare Other | Source: Ambulatory Visit | Attending: Internal Medicine | Admitting: Internal Medicine

## 2017-02-13 DIAGNOSIS — Z1231 Encounter for screening mammogram for malignant neoplasm of breast: Secondary | ICD-10-CM

## 2017-04-06 ENCOUNTER — Other Ambulatory Visit: Payer: Self-pay | Admitting: Internal Medicine

## 2017-05-02 ENCOUNTER — Other Ambulatory Visit: Payer: Self-pay | Admitting: Internal Medicine

## 2017-06-01 ENCOUNTER — Other Ambulatory Visit: Payer: Self-pay

## 2017-07-22 DIAGNOSIS — Z23 Encounter for immunization: Secondary | ICD-10-CM | POA: Diagnosis not present

## 2017-08-09 ENCOUNTER — Other Ambulatory Visit: Payer: Self-pay | Admitting: Internal Medicine

## 2017-12-14 ENCOUNTER — Other Ambulatory Visit: Payer: Self-pay

## 2017-12-14 MED ORDER — AMLODIPINE BESYLATE 10 MG PO TABS: 10.0000 mg | ORAL_TABLET | Freq: Every day | ORAL | 2 refills | Status: DC

## 2017-12-14 MED ORDER — LOSARTAN POTASSIUM 100 MG PO TABS: 100.0000 mg | ORAL_TABLET | Freq: Every day | ORAL | 2 refills | Status: DC

## 2017-12-14 MED ORDER — METOPROLOL SUCCINATE ER 100 MG PO TB24: ORAL_TABLET | ORAL | 2 refills | Status: DC

## 2017-12-14 NOTE — Addendum Note (Signed)
Addended by: Mady Haagensen on: 12/14/2017 11:32 AM   Modules accepted: Orders

## 2018-01-28 ENCOUNTER — Other Ambulatory Visit: Payer: Self-pay | Admitting: Internal Medicine

## 2018-01-28 DIAGNOSIS — I10 Essential (primary) hypertension: Secondary | ICD-10-CM

## 2018-01-28 DIAGNOSIS — E559 Vitamin D deficiency, unspecified: Secondary | ICD-10-CM

## 2018-01-28 DIAGNOSIS — Z0000 Encounter for general adult medical examination without abnormal findings: Secondary | ICD-10-CM

## 2018-01-28 DIAGNOSIS — Z8673 Personal history of transient ischemic attack (TIA), and cerebral infarction without residual deficits: Secondary | ICD-10-CM

## 2018-01-28 DIAGNOSIS — Z1321 Encounter for screening for nutritional disorder: Secondary | ICD-10-CM

## 2018-01-28 DIAGNOSIS — E118 Type 2 diabetes mellitus with unspecified complications: Secondary | ICD-10-CM

## 2018-01-28 DIAGNOSIS — E7849 Other hyperlipidemia: Secondary | ICD-10-CM

## 2018-02-04 ENCOUNTER — Other Ambulatory Visit: Payer: Medicare Other | Admitting: Internal Medicine

## 2018-02-04 DIAGNOSIS — H401133 Primary open-angle glaucoma, bilateral, severe stage: Secondary | ICD-10-CM | POA: Diagnosis not present

## 2018-02-04 DIAGNOSIS — Z0000 Encounter for general adult medical examination without abnormal findings: Secondary | ICD-10-CM | POA: Diagnosis not present

## 2018-02-04 DIAGNOSIS — E7849 Other hyperlipidemia: Secondary | ICD-10-CM | POA: Diagnosis not present

## 2018-02-04 DIAGNOSIS — E118 Type 2 diabetes mellitus with unspecified complications: Secondary | ICD-10-CM | POA: Diagnosis not present

## 2018-02-04 DIAGNOSIS — Z1321 Encounter for screening for nutritional disorder: Secondary | ICD-10-CM | POA: Diagnosis not present

## 2018-02-04 DIAGNOSIS — I10 Essential (primary) hypertension: Secondary | ICD-10-CM | POA: Diagnosis not present

## 2018-02-04 DIAGNOSIS — E559 Vitamin D deficiency, unspecified: Secondary | ICD-10-CM | POA: Diagnosis not present

## 2018-02-04 DIAGNOSIS — Z8673 Personal history of transient ischemic attack (TIA), and cerebral infarction without residual deficits: Secondary | ICD-10-CM

## 2018-02-04 DIAGNOSIS — H5203 Hypermetropia, bilateral: Secondary | ICD-10-CM | POA: Diagnosis not present

## 2018-02-05 ENCOUNTER — Other Ambulatory Visit: Payer: Self-pay | Admitting: Internal Medicine

## 2018-02-05 LAB — LIPID PANEL
CHOLESTEROL: 264 mg/dL — AB (ref <200)
HDL: 49 mg/dL — ABNORMAL LOW (ref >50)
LDL CHOLESTEROL (CALC): 175 mg/dL — AB
Non-HDL Cholesterol (Calc): 215 mg/dL (calc) — ABNORMAL HIGH (ref <130)
Total CHOL/HDL Ratio: 5.4 (calc) — ABNORMAL HIGH (ref <5.0)
Triglycerides: 237 mg/dL — ABNORMAL HIGH (ref <150)

## 2018-02-05 LAB — CBC WITH DIFFERENTIAL/PLATELET
BASOS ABS: 42 {cells}/uL (ref 0–200)
Basophils Relative: 0.6 %
EOS ABS: 91 {cells}/uL (ref 15–500)
Eosinophils Relative: 1.3 %
HCT: 38.2 % (ref 35.0–45.0)
Hemoglobin: 12.5 g/dL (ref 11.7–15.5)
Lymphs Abs: 1631 cells/uL (ref 850–3900)
MCH: 29.9 pg (ref 27.0–33.0)
MCHC: 32.7 g/dL (ref 32.0–36.0)
MCV: 91.4 fL (ref 80.0–100.0)
MPV: 8.9 fL (ref 7.5–12.5)
Monocytes Relative: 7.4 %
NEUTROS PCT: 67.4 %
Neutro Abs: 4718 cells/uL (ref 1500–7800)
PLATELETS: 322 10*3/uL (ref 140–400)
RBC: 4.18 10*6/uL (ref 3.80–5.10)
RDW: 11.5 % (ref 11.0–15.0)
TOTAL LYMPHOCYTE: 23.3 %
WBC: 7 10*3/uL (ref 3.8–10.8)
WBCMIX: 518 {cells}/uL (ref 200–950)

## 2018-02-05 LAB — COMPLETE METABOLIC PANEL WITH GFR
AG RATIO: 1.4 (calc) (ref 1.0–2.5)
ALBUMIN MSPROF: 4.2 g/dL (ref 3.6–5.1)
ALT: 17 U/L (ref 6–29)
AST: 18 U/L (ref 10–35)
Alkaline phosphatase (APISO): 87 U/L (ref 33–130)
BUN: 9 mg/dL (ref 7–25)
CALCIUM: 9.7 mg/dL (ref 8.6–10.4)
CO2: 27 mmol/L (ref 20–32)
CREATININE: 0.76 mg/dL (ref 0.60–0.88)
Chloride: 101 mmol/L (ref 98–110)
GFR, EST NON AFRICAN AMERICAN: 70 mL/min/{1.73_m2} (ref >=60)
GFR, Est African American: 81 mL/min/{1.73_m2} (ref >=60)
GLOBULIN: 2.9 g/dL (ref 1.9–3.7)
Glucose, Bld: 135 mg/dL — ABNORMAL HIGH (ref 65–99)
POTASSIUM: 3.6 mmol/L (ref 3.5–5.3)
SODIUM: 138 mmol/L (ref 135–146)
Total Bilirubin: 0.6 mg/dL (ref 0.2–1.2)
Total Protein: 7.1 g/dL (ref 6.1–8.1)

## 2018-02-05 LAB — TEST AUTHORIZATION

## 2018-02-05 LAB — MICROALBUMIN / CREATININE URINE RATIO
Creatinine, Urine: 396 mg/dL — ABNORMAL HIGH (ref 20–275)
Microalb Creat Ratio: 15 ug/mg{creat}
Microalb, Ur: 5.8 mg/dL

## 2018-02-05 LAB — HEMOGLOBIN A1C
HEMOGLOBIN A1C: 5.3 %{Hb} (ref <5.7)
Mean Plasma Glucose: 105 (calc)
eAG (mmol/L): 5.8 (calc)

## 2018-02-05 LAB — VITAMIN B12

## 2018-02-05 LAB — TSH: TSH: 1.02 m[IU]/L (ref 0.40–4.50)

## 2018-02-05 LAB — VITAMIN D 25 HYDROXY (VIT D DEFICIENCY, FRACTURES): Vit D, 25-Hydroxy: 7 ng/mL — ABNORMAL LOW (ref 30–100)

## 2018-02-08 ENCOUNTER — Encounter: Payer: Self-pay | Admitting: Internal Medicine

## 2018-02-08 ENCOUNTER — Ambulatory Visit (INDEPENDENT_AMBULATORY_CARE_PROVIDER_SITE_OTHER): Payer: Medicare Other | Admitting: Internal Medicine

## 2018-02-08 VITALS — BP 120/60 | HR 86 | Ht 59.0 in | Wt 138.0 lb

## 2018-02-08 DIAGNOSIS — R609 Edema, unspecified: Secondary | ICD-10-CM

## 2018-02-08 DIAGNOSIS — Z8673 Personal history of transient ischemic attack (TIA), and cerebral infarction without residual deficits: Secondary | ICD-10-CM

## 2018-02-08 DIAGNOSIS — E559 Vitamin D deficiency, unspecified: Secondary | ICD-10-CM | POA: Diagnosis not present

## 2018-02-08 DIAGNOSIS — E782 Mixed hyperlipidemia: Secondary | ICD-10-CM | POA: Diagnosis not present

## 2018-02-08 DIAGNOSIS — E118 Type 2 diabetes mellitus with unspecified complications: Secondary | ICD-10-CM

## 2018-02-08 DIAGNOSIS — I10 Essential (primary) hypertension: Secondary | ICD-10-CM

## 2018-02-08 DIAGNOSIS — R8290 Unspecified abnormal findings in urine: Secondary | ICD-10-CM | POA: Diagnosis not present

## 2018-02-08 DIAGNOSIS — Z0000 Encounter for general adult medical examination without abnormal findings: Secondary | ICD-10-CM

## 2018-02-08 DIAGNOSIS — H409 Unspecified glaucoma: Secondary | ICD-10-CM

## 2018-02-08 LAB — POCT URINALYSIS DIPSTICK
Appearance: ABNORMAL
Bilirubin, UA: NEGATIVE
Glucose, UA: NEGATIVE
KETONES UA: NEGATIVE
NITRITE UA: NEGATIVE
ODOR: ABNORMAL
PH UA: 6 (ref 5.0–8.0)
SPEC GRAV UA: 1.02 (ref 1.010–1.025)
UROBILINOGEN UA: 0.2 U/dL

## 2018-02-08 NOTE — Progress Notes (Signed)
Subjective:    Patient ID: Amber Chang, female    DOB: [birth year], 84 y.o.   MRN: [id number] 84 year old Female in today for health maintenance exam, Medicare wellness and evaluation of medical issues.  Daughter, Amber Chang who is also a patient here,  says patient has been off her Crestor.  Apparently drugstore changed the color of the medication HPI and she was confused about it.  Daughter has noted more confusion recently.  Her feet have been swelling.  She is on amlodipine 10 mg daily in addition to beta-blocker, and losartan.  She currently is not on a diuretic.  There is no previous EKG on file in Epic.  EKG today shows no acute changes in sinus rhythm.  Echo in [place] in 2014 showed mild diastolic dysfunction with an ejection fraction of 64%.  Normal systolic function.  This was done because of syncope.  EKG done February 2014 in [place] showed normal sinus rhythm.  She moved here in January 2017 to be with her daughter.  In by Dr. Shawnie Pons  In [facility] for [facility] care.  [name] previously resided in [place] in [place].  History of essential hypertension, controlled type 2 diabetes mellitus, hyperlipidemia.  History of allergic rhinitis treated with Zyrtec.  Hysterectomy at age 3  She is allergic to penicillin causes hives  Patient had a stroke in the remote past.  It is not exactly clear from old records when that occurred.  She is been maintained on Plavix and aspirin.  History of glaucoma and has 2 different types of eyedrops she uses.  A colonoscopy in 2001  Social history: She is retired from the [employer], [employer].  Husband was a truck driver but he passed away.  She worked as a Programmer, applications.  Hobbies included sewing.  She does not smoke or consume alcohol.  In her old records there is a report of carotid Dopplers from February 2014 indicating she had an episode of  syncope.  She had minimal calcified plaque in the proximal left internal carotid artery.  Right carotid was normal.  She had a 2D echocardiogram showing mild diastolic dysfunction.  EKG done February 2014 was within normal limits.  Family history: Father died at age 28 with history of stroke and pneumonia.  Mother died at age 17 of a stroke.  10 brothers are all deceased.  2 brothers died of strokes.  3 died of cancer.  One died of brain cancer.  2 died of GI cancers.   All brothers had hypertension.  One  daughter died at age 69 in an automobile accident.    Review of Systems  Constitutional: Negative.   All other systems reviewed and are negative.      Objective:   Physical Exam  Constitutional: She appears well-developed and well-nourished.  HENT:  Head: Normocephalic and atraumatic.  Right Ear: External ear normal.  Left Ear: External ear normal.  Mouth/Throat: Oropharynx is clear and moist.  Eyes: Conjunctivae are normal. Right eye exhibits no discharge. Left eye exhibits no discharge. No scleral icterus.  Neck: Neck supple. No JVD present. No thyromegaly present.  Cardiovascular: Normal rate, regular rhythm, normal heart sounds and intact distal pulses.  No murmur heard. Pulmonary/Chest: Effort normal and breath sounds normal. No respiratory distress. She has no wheezes. She has no rales.  Abdominal: Soft. Bowel sounds are normal. She exhibits no distension and no mass. There is no tenderness. There is  no rebound and no guarding.  Genitourinary:  Genitourinary Comments: Deferred  Musculoskeletal: She exhibits no edema.  Lymphadenopathy:    She has no cervical adenopathy.  Neurological: No cranial nerve deficit. Coordination normal.  Skin: No rash noted.  Psychiatric: She has a normal mood and affect. Her behavior is normal.  Vitals reviewed.  O2 sat is 95%.  Weight stable at 138 pounds and was 139 pounds in January 2018.       Assessment & Plan:  Normal health weight  is exam  Controlled type 2 diabetes mellitus-hemoglobin A1c is normal.  Fasting glucose is 135  Hyperlipidemia-looks like she has not been taking lipid-lowering medication based on lipid panel with elevated LDL, triglycerides and total cholesterol  History of stroke  Essential hypertension  History of glaucoma  Lower extremity edema-BNP is normal consistent with dependent edema  Vitamin D deficiency recommend 50,000 units vitamin D3 weekly  Subjective:   Patient presents for Medicare Annual/Subsequent preventive examination.  Review Past Medical/Family/Social: See above   Risk Factors  Current exercise habits: Sedentary Dietary issues discussed: Low-fat low carbohydrate  Cardiac risk factors: Family history of stroke, patient has hypertension and diabetes  Depression Screen  (Note: if answer to either of the following is "Yes", a more complete depression screening is indicated)   Over the past two weeks, have you felt down, depressed or hopeless? No  Over the past two weeks, have you felt little interest or pleasure in doing things? No Have you lost interest or pleasure in daily life? No Do you often feel hopeless? No Do you cry easily over simple problems? No   Activities of Daily Living  In your present state of health, do you have any difficulty performing the following activities?:   Driving?  Does not drive Managing money? No  Feeding yourself? No  Getting from bed to chair? No  Climbing a flight of stairs? No  Preparing food and eating?: No  Bathing or showering? No  Getting dressed: No  Getting to the toilet? No  Using the toilet:No  Moving around from place to place: No  In the past year have you fallen or had a near fall?:No  Are you sexually active? No  Do you have more than one partner? No   Hearing Difficulties: No  Do you often ask people to speak up or repeat themselves? No  Do you experience ringing or noises in your ears?  yes  Do you have  difficulty understanding soft or whispered voices?  Yes Do you feel that you have a problem with memory?  Yes do you often misplace items?  yes    Home Safety:  Do you have a smoke alarm at your residence? Yes Do you have grab bars in the bathroom?  No Do you have throw rugs in your house?  No   Cognitive Testing  Alert? Yes Normal Appearance?Yes  Oriented to person? Yes Place? Yes  Time? not tested Recall of three objects? not tested Can perform simple calculations?  not tested Displays appropriate judgment?Yes  Can read the correct time from a watch face  Not tested  List the Names of Other Physician/Practitioners you currently use:  See referral list for the physicians patient is currently seeing.     Review of Systems: See above   Objective:     General appearance: Appears stated age, pleasant and cooperative Head: Normocephalic, without obvious abnormality, atraumatic  Eyes: conj clear, EOMi PEERLA  Ears: normal TM's and external ear  canals both ears  Nose: Nares normal. Septum midline. Mucosa normal. No drainage or sinus tenderness.  Throat: lips, mucosa, and tongue normal; teeth and gums normal  Neck: no adenopathy, no carotid bruit, no JVD, supple, symmetrical, trachea midline and thyroid not enlarged, symmetric, no tenderness/mass/nodules  No CVA tenderness.  Lungs: clear to auscultation bilaterally  Breasts: normal appearance, no masses or tenderness Heart: regular rate and rhythm, S1, S2 normal, no murmur, click, rub or gallop  Abdomen: soft, non-tender; bowel sounds normal; no masses, no organomegaly  Musculoskeletal: ROM normal in all joints, no crepitus, no deformity, Normal muscle strengthen. Back  is symmetric, no curvature. Skin: Skin color, texture, turgor normal. No rashes or lesions  Lymph nodes: Cervical, supraclavicular, and axillary nodes normal.  Neurologic: CN 2 -12 Normal, Normal symmetric reflexes. Normal coordination and gait  Psych: Alert &  Oriented x 3, Mood appear stable.    Assessment:    Annual wellness medicare exam   Plan:    During the course of the visit the patient was educated and counseled about appropriate screening and preventive services including:   Annual flu vaccine     Patient Instructions (the written plan) was given to the patient.  Medicare Attestation  I have personally reviewed:  The patient's medical and social history  Their use of alcohol, tobacco or illicit drugs  Their current medications and supplements  The patient's functional ability including ADLs,fall risks, home safety risks, cognitive, and hearing and visual impairment  Diet and physical activities  Evidence for depression or mood disorders  The patient's weight, height, BMI, and visual acuity have been recorded in the chart. I have made referrals, counseling, and provided education to the patient based on review of the above and I have provided the patient with a written personalized care plan for preventive services.

## 2018-02-09 LAB — URINE CULTURE
MICRO NUMBER:: 90401439
SPECIMEN QUALITY: ADEQUATE

## 2018-02-09 LAB — BRAIN NATRIURETIC PEPTIDE: BRAIN NATRIURETIC PEPTIDE: 52 pg/mL (ref <100)

## 2018-02-15 ENCOUNTER — Other Ambulatory Visit: Payer: Self-pay

## 2018-02-15 MED ORDER — ROSUVASTATIN CALCIUM 20 MG PO TABS: 20.0000 mg | ORAL_TABLET | Freq: Every day | ORAL | 1 refills | Status: DC

## 2018-03-08 NOTE — Patient Instructions (Addendum)
It was a pleasure to see you today.  Start back on Crestor- we can repeat lipid panel liver functions in 6 months

## 2018-04-29 DIAGNOSIS — H401133 Primary open-angle glaucoma, bilateral, severe stage: Secondary | ICD-10-CM | POA: Diagnosis not present

## 2018-05-04 ENCOUNTER — Other Ambulatory Visit: Payer: Self-pay | Admitting: Internal Medicine

## 2018-05-14 ENCOUNTER — Other Ambulatory Visit: Payer: Self-pay | Admitting: Internal Medicine

## 2018-06-02 ENCOUNTER — Other Ambulatory Visit: Payer: Self-pay | Admitting: Internal Medicine

## 2018-08-04 DIAGNOSIS — Z23 Encounter for immunization: Secondary | ICD-10-CM | POA: Diagnosis not present

## 2018-08-09 ENCOUNTER — Telehealth: Payer: Self-pay | Admitting: Internal Medicine

## 2018-08-11 ENCOUNTER — Other Ambulatory Visit: Payer: Self-pay | Admitting: Internal Medicine

## 2018-08-11 NOTE — Telephone Encounter (Signed)
Opened telephone encounter in error.

## 2018-08-12 ENCOUNTER — Other Ambulatory Visit: Payer: Self-pay | Admitting: Internal Medicine

## 2018-08-21 ENCOUNTER — Other Ambulatory Visit: Payer: Self-pay | Admitting: Internal Medicine

## 2018-08-27 DIAGNOSIS — H401133 Primary open-angle glaucoma, bilateral, severe stage: Secondary | ICD-10-CM | POA: Diagnosis not present

## 2018-09-03 ENCOUNTER — Other Ambulatory Visit: Payer: Self-pay | Admitting: Internal Medicine

## 2018-09-03 NOTE — Telephone Encounter (Signed)
Verbal order by Dr. Renold Genta to refill Losartan 100mg  #60, 11 refills.  Take 1 tablet by mouth once daily.   [facility] on [address] [phone], spoke with Merrilee Seashore.

## 2018-09-06 ENCOUNTER — Other Ambulatory Visit: Payer: Self-pay | Admitting: Internal Medicine

## 2018-11-05 ENCOUNTER — Other Ambulatory Visit: Payer: Self-pay | Admitting: Internal Medicine

## 2018-12-31 ENCOUNTER — Encounter: Payer: Self-pay | Admitting: Internal Medicine

## 2018-12-31 DIAGNOSIS — H401133 Primary open-angle glaucoma, bilateral, severe stage: Secondary | ICD-10-CM | POA: Diagnosis not present

## 2018-12-31 DIAGNOSIS — H5203 Hypermetropia, bilateral: Secondary | ICD-10-CM | POA: Diagnosis not present

## 2018-12-31 LAB — HM DIABETES EYE EXAM

## 2019-01-24 ENCOUNTER — Other Ambulatory Visit: Payer: Self-pay

## 2019-01-24 MED ORDER — METOPROLOL SUCCINATE ER 100 MG PO TB24: ORAL_TABLET | ORAL | 5 refills | Status: DC

## 2019-01-31 ENCOUNTER — Telehealth: Payer: Self-pay | Admitting: Internal Medicine

## 2019-01-31 DIAGNOSIS — E119 Type 2 diabetes mellitus without complications: Secondary | ICD-10-CM

## 2019-01-31 MED ORDER — PRODIGY AUTOCODE BLOOD GLUCOSE W/DEVICE KIT: PACK | 0 refills | Status: DC

## 2019-01-31 NOTE — Telephone Encounter (Signed)
[name] Daughter [phone]  [facility] - [address]  Prodojiy test strips and new meter, hers stopped working.  [name] called to see if they could get new prescription.

## 2019-02-03 ENCOUNTER — Other Ambulatory Visit: Payer: Self-pay | Admitting: Internal Medicine

## 2019-02-15 ENCOUNTER — Other Ambulatory Visit: Payer: Medicare Other | Admitting: Internal Medicine

## 2019-02-17 ENCOUNTER — Encounter: Payer: Medicare Other | Admitting: Internal Medicine

## 2019-03-01 ENCOUNTER — Other Ambulatory Visit: Payer: Medicare Other | Admitting: Internal Medicine

## 2019-03-03 ENCOUNTER — Encounter: Payer: Medicare Other | Admitting: Internal Medicine

## 2019-03-14 ENCOUNTER — Telehealth: Payer: Self-pay | Admitting: Internal Medicine

## 2019-03-14 MED ORDER — METFORMIN HCL 500 MG PO TABS: 500.0000 mg | ORAL_TABLET | Freq: Two times a day (BID) | ORAL | 1 refills | Status: DC

## 2019-03-14 NOTE — Telephone Encounter (Addendum)
Received Fax RX request from  [facility]  Medication - metFORMIN (GLUCOPHAGE) 500 MG tablet [   Last Refill - 12.27.19  Last OV - 4.1.19  Last CPE - 4.1.19  Next CPE scheduled 6.25.20

## 2019-05-02 ENCOUNTER — Other Ambulatory Visit: Payer: Self-pay | Admitting: Internal Medicine

## 2019-05-02 ENCOUNTER — Other Ambulatory Visit: Payer: Self-pay

## 2019-05-02 ENCOUNTER — Other Ambulatory Visit (INDEPENDENT_AMBULATORY_CARE_PROVIDER_SITE_OTHER): Payer: Medicare Other | Admitting: Internal Medicine

## 2019-05-02 DIAGNOSIS — E782 Mixed hyperlipidemia: Secondary | ICD-10-CM

## 2019-05-02 DIAGNOSIS — I10 Essential (primary) hypertension: Secondary | ICD-10-CM

## 2019-05-02 DIAGNOSIS — Z1321 Encounter for screening for nutritional disorder: Secondary | ICD-10-CM | POA: Diagnosis not present

## 2019-05-02 DIAGNOSIS — Z0000 Encounter for general adult medical examination without abnormal findings: Secondary | ICD-10-CM

## 2019-05-02 DIAGNOSIS — E119 Type 2 diabetes mellitus without complications: Secondary | ICD-10-CM | POA: Diagnosis not present

## 2019-05-02 DIAGNOSIS — R8290 Unspecified abnormal findings in urine: Secondary | ICD-10-CM

## 2019-05-02 DIAGNOSIS — E559 Vitamin D deficiency, unspecified: Secondary | ICD-10-CM

## 2019-05-02 LAB — POCT URINALYSIS DIPSTICK
Glucose, UA: NEGATIVE
Ketones, UA: NEGATIVE
Nitrite, UA: NEGATIVE
Protein, UA: POSITIVE — AB
Spec Grav, UA: 1.015 (ref 1.010–1.025)
Urobilinogen, UA: 0.2 E.U./dL
pH, UA: 7 (ref 5.0–8.0)

## 2019-05-02 NOTE — Addendum Note (Signed)
Addended by: Virl Cagey on: 05/02/2019 11:47 AM   Modules accepted: Orders

## 2019-05-02 NOTE — Addendum Note (Signed)
Addended by: Virl Cagey on: 05/02/2019 11:58 AM   Modules accepted: Orders

## 2019-05-03 ENCOUNTER — Other Ambulatory Visit: Payer: Medicare Other | Admitting: Internal Medicine

## 2019-05-03 LAB — URINE CULTURE
MICRO NUMBER:: 593410
SPECIMEN QUALITY:: ADEQUATE

## 2019-05-03 LAB — LIPID PANEL
Cholesterol: 133 mg/dL (ref <200)
HDL: 52 mg/dL (ref >=50)
LDL Cholesterol (Calc): 61 mg/dL (calc)
Non-HDL Cholesterol (Calc): 81 mg/dL (calc) (ref <130)
Total CHOL/HDL Ratio: 2.6 (calc) (ref <5.0)
Triglycerides: 115 mg/dL (ref <150)

## 2019-05-03 LAB — COMPREHENSIVE METABOLIC PANEL
AG Ratio: 1.5 (calc) (ref 1.0–2.5)
ALT: 18 U/L (ref 6–29)
AST: 20 U/L (ref 10–35)
Albumin: 4 g/dL (ref 3.6–5.1)
Alkaline phosphatase (APISO): 81 U/L (ref 37–153)
BUN: 8 mg/dL (ref 7–25)
CO2: 29 mmol/L (ref 20–32)
Calcium: 9.7 mg/dL (ref 8.6–10.4)
Chloride: 99 mmol/L (ref 98–110)
Creat: 0.71 mg/dL (ref 0.60–0.88)
Globulin: 2.7 g/dL (calc) (ref 1.9–3.7)
Glucose, Bld: 108 mg/dL — ABNORMAL HIGH (ref 65–99)
Potassium: 3.7 mmol/L (ref 3.5–5.3)
Sodium: 138 mmol/L (ref 135–146)
Total Bilirubin: 0.6 mg/dL (ref 0.2–1.2)
Total Protein: 6.7 g/dL (ref 6.1–8.1)

## 2019-05-03 LAB — HOUSE ACCOUNT TRACKING

## 2019-05-03 LAB — CBC WITH DIFFERENTIAL/PLATELET
Absolute Monocytes: 530 cells/uL (ref 200–950)
Basophils Absolute: 31 cells/uL (ref 0–200)
Basophils Relative: 0.4 %
Eosinophils Absolute: 133 cells/uL (ref 15–500)
Eosinophils Relative: 1.7 %
HCT: 35.6 % (ref 35.0–45.0)
Hemoglobin: 11.5 g/dL — ABNORMAL LOW (ref 11.7–15.5)
Lymphs Abs: 1381 cells/uL (ref 850–3900)
MCH: 30.7 pg (ref 27.0–33.0)
MCHC: 32.3 g/dL (ref 32.0–36.0)
MCV: 94.9 fL (ref 80.0–100.0)
MPV: 8.4 fL (ref 7.5–12.5)
Monocytes Relative: 6.8 %
Neutro Abs: 5725 cells/uL (ref 1500–7800)
Neutrophils Relative %: 73.4 %
Platelets: 317 10*3/uL (ref 140–400)
RBC: 3.75 10*6/uL — ABNORMAL LOW (ref 3.80–5.10)
RDW: 11.2 % (ref 11.0–15.0)
Total Lymphocyte: 17.7 %
WBC: 7.8 10*3/uL (ref 3.8–10.8)

## 2019-05-03 LAB — HEMOGLOBIN A1C
Hgb A1c MFr Bld: 5.1 % of total Hgb (ref <5.7)
Mean Plasma Glucose: 100 (calc)
eAG (mmol/L): 5.5 (calc)

## 2019-05-03 LAB — TSH: TSH: 1.16 mIU/L (ref 0.40–4.50)

## 2019-05-04 ENCOUNTER — Other Ambulatory Visit: Payer: Self-pay | Admitting: Internal Medicine

## 2019-05-05 ENCOUNTER — Other Ambulatory Visit: Payer: Self-pay

## 2019-05-05 ENCOUNTER — Ambulatory Visit (INDEPENDENT_AMBULATORY_CARE_PROVIDER_SITE_OTHER): Payer: Medicare Other | Admitting: Internal Medicine

## 2019-05-05 ENCOUNTER — Encounter: Payer: Self-pay | Admitting: Internal Medicine

## 2019-05-05 VITALS — BP 120/70 | HR 72 | Temp 98.7°F | Ht 59.0 in | Wt 127.0 lb

## 2019-05-05 DIAGNOSIS — E785 Hyperlipidemia, unspecified: Secondary | ICD-10-CM | POA: Diagnosis not present

## 2019-05-05 DIAGNOSIS — R413 Other amnesia: Secondary | ICD-10-CM | POA: Diagnosis not present

## 2019-05-05 DIAGNOSIS — R8290 Unspecified abnormal findings in urine: Secondary | ICD-10-CM | POA: Diagnosis not present

## 2019-05-05 DIAGNOSIS — H409 Unspecified glaucoma: Secondary | ICD-10-CM

## 2019-05-05 DIAGNOSIS — Z8673 Personal history of transient ischemic attack (TIA), and cerebral infarction without residual deficits: Secondary | ICD-10-CM

## 2019-05-05 DIAGNOSIS — E782 Mixed hyperlipidemia: Secondary | ICD-10-CM | POA: Diagnosis not present

## 2019-05-05 DIAGNOSIS — R609 Edema, unspecified: Secondary | ICD-10-CM

## 2019-05-05 DIAGNOSIS — R634 Abnormal weight loss: Secondary | ICD-10-CM | POA: Diagnosis not present

## 2019-05-05 DIAGNOSIS — E1169 Type 2 diabetes mellitus with other specified complication: Secondary | ICD-10-CM | POA: Diagnosis not present

## 2019-05-05 DIAGNOSIS — Z0000 Encounter for general adult medical examination without abnormal findings: Secondary | ICD-10-CM | POA: Diagnosis not present

## 2019-05-05 LAB — POCT URINALYSIS DIPSTICK
Bilirubin, UA: NEGATIVE
Glucose, UA: NEGATIVE
Ketones, UA: NEGATIVE
Nitrite, UA: NEGATIVE
Protein, UA: POSITIVE — AB
Spec Grav, UA: 1.015 (ref 1.010–1.025)
Urobilinogen, UA: 0.2 E.U./dL
pH, UA: 6 (ref 5.0–8.0)

## 2019-05-05 MED ORDER — DONEPEZIL HCL 5 MG PO TABS: 5.0000 mg | ORAL_TABLET | Freq: Every day | ORAL | 2 refills | Status: DC

## 2019-05-05 MED ORDER — ROSUVASTATIN CALCIUM 20 MG PO TABS: ORAL_TABLET | ORAL | 3 refills | Status: DC

## 2019-05-05 NOTE — Patient Instructions (Signed)
It was a pleasure to see you today.  Meals on Wheels will be ordered.  Follow-up in early August.  Continue same medications.

## 2019-05-05 NOTE — Progress Notes (Signed)
Subjective:    Patient ID: DEIRDRE GRYDER, female    DOB: 08-24-1927, 85 y.o.   MRN: [id number]  HPI 85 year old Female in today for Medicare wellness, health maintenance exam and evaluation of multiple medical issues.  She is accompanied by her daughter, [name] who is also a patient here.  There have been concerns recently about Mrs. Grillo's memory.  She knows that Trump is the president but cannot say his name without prompting.  He does not know the day of the week nor the year or the month.  She is pleasant and cooperative.  She seldom leaves the house.  She no longer drives.  She has a dog.  She is lost 11 pounds.  Fraser Din thinks that Mrs. Lueck is feeding the dog some of her medial.  Fraser Din would like to have Meals on Wheels for Mrs. Manthe and we will try to arrange that.  Mrs. Whiteman previously resided in [place] in [place].  She moved here in January 2017 to be with her daughter.  Her daughter is a widow.  They get along well.  She had an echocardiogram in [place] in 2014 that showed mild diastolic dysfunction with an ejection fraction of 64%.  She has a history of essential hypertension, hyperlipidemia controlled type 2 diabetes mellitus.  History of allergic rhinitis treated with Zyrtec.  She is allergic to penicillin-causes hives.  Patient had a stroke in the remote past but it is not clear from old records when that occurred.  She has been maintained on Plavix and aspirin.  History of glaucoma has 2 different types of eyedrops she uses.  Ophthalmologist is [facility].  She had colonoscopy in 2001.  Had hysterectomy at age 21.  Social history: Retired from the [employer].  Husband was a truck driver but he passed away.  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  She does not smoke or consume alcohol.  Old records indicate carotid 2014 after she had an  episode of syncope.  She had minimal calcified plaque in the proximal left internal carotid artery.  Right carotid was normal.  She had a 2D echocardiogram showing mild diastolic dysfunction.  EKG done February 2014 was normal.  Family history: Father died at age 55 with history of stroke and pneumonia.  Mother died at age 61 of a stroke.  10 brothers are all deceased.  2 brothers died of strokes.  3 brothers died of cancer.  One brother died of brain cancer.  2 brothers died of GI cancers.  All brothers have hypertension.  One daughter died at age 83 in an automobile accident.  Daughter [name] has history of cardiomyopathy and hypertension.  Previous weight was 138 pounds in April 2019 and is now 127 pounds.  We are going to place her on Aricept 5 mg daily for memory loss.  Daughter understands this will not improve memory but help prevent memory from getting worse.  We can also add Namenda at a later time.  We can add an appetite stimulant but prefers that we give Meals on Wheels a try.  She eats crackers for lunch.  Her best meal was breakfast.  She has a longstanding history of dependent edema.  Her hemoglobin stable at 11.5 g but a year ago was 12.5 g.  Her lipids were normal but last year they were elevated.  She may had not been taking her statin medication  when they were checked last year.  They were normal 2 years ago.  Her fasting glucose is 108.  She has normal BUN and creatinine.  Liver functions are normal and TSH is normal.      Review of Systems  HENT: Negative.   Respiratory: Negative.   Cardiovascular: Negative.   Gastrointestinal: Negative.   Genitourinary: Negative.   Neurological: Negative.   Psychiatric/Behavioral: Negative.        Objective:   Physical Exam Blood pressure 120/70, pulse 72, temperature 98.7 degrees orally pulse oximetry 95%.  Weight 127 pounds height 4 feet 11 inches.  BMI 25.65.  Skin warm and dry.  Nodes none.  TMs and pharynx are clear.  PERRLA.   Neck is supple.  No thyromegaly.  Chest clear to auscultation without rales or wheezing.  Cardiac exam regular rate and rhythm normal S1 and S2.  Breasts normal female without masses.  Abdomen no hepatosplenomegaly masses or tenderness.  She has small epidermoid cyst right upper arm near her shoulder.  She has 1+ pitting edema of her ankles.  Moves all 4 extremities.  She is alert but disoriented to month, day of week and year.       Assessment & Plan:  Senile dementia-start Aricept 5 mg daily  11 pound weight loss-ordered Meals on Wheels and follow-up in about 6 weeks with weight check  Essential hypertension stable on current regimen  Hyperlipidemia-normal lipids on statin  Controlled type 2 diabetes mellitus on metformin  History of stroke in the remote past treated with aspirin and Plavix  History of glaucoma followed by ophthalmology  Plan: Follow-up in August 6 with weight check and office visit.  Meals on Wheels will be ordered.  We will follow-up with Aricept at that time.  Subjective:   Patient presents for Medicare Annual/Subsequent preventive examination.  Review Past Medical/Family/Social:   Risk Factors  Current exercise habits: Sedentary Dietary issues discussed: Yes  Cardiac risk factors: History of stroke, hyperlipidemia, diabetes, hypertension, family history of stroke  Depression Screen  (Note: if answer to either of the following is "Yes", a more complete depression screening is indicated)   Over the past two weeks, have you felt down, depressed or hopeless? No  Over the past two weeks, have you felt little interest or pleasure in doing things? No Have you lost interest or pleasure in daily life? No Do you often feel hopeless? No Do you cry easily over simple problems? No   Activities of Daily Living  In your present state of health, do you have any difficulty performing the following activities?:   Driving? No  Managing money? No  Feeding yourself?  No  Getting from bed to chair? No  Climbing a flight of stairs? No  Preparing food and eating?:  Needs assistance Bathing or showering? No  Getting dressed: No  Getting to the toilet? No  Using the toilet:No  Moving around from place to place: No  In the past year have you fallen or had a near fall?:No  Are you sexually active? No  Do you have more than one partner? No   Hearing Difficulties: No  Do you often ask people to speak up or repeat themselves? No  Do you experience ringing or noises in your ears?  Yes Do you have difficulty understanding soft or whispered voices?  Sometimes do you feel that you have a problem with memory?  Her daughter think she does Do you often misplace items?  Yes   Home  Safety:  Do you have a smoke alarm at your residence? Yes Do you have grab bars in the bathroom?  No Do you have throw rugs in your house?  None   Cognitive Testing  Alert? Yes Normal Appearance?Yes  Oriented to person? Yes Place? Yes  Time? no Recall of three objects?  not tested because she is disoriented to year, month, day avoid Can perform simple calculations?  Not tested Displays appropriate judgment?Yes  Can read the correct time from a watch face?Yes   List the Names of Other Physician/Practitioners you currently use:  See referral list for the physicians patient is currently seeing.  Ophthalmologist   Review of Systems: See above   Objective:     General appearance: Well-developed in no acute distress.  Pleasant and cooperative. Head: Normocephalic, without obvious abnormality, atraumatic  Eyes: conj clear, EOMi PEERLA  Ears: normal TM's and external ear canals both ears  Nose: Nares normal. Septum midline. Mucosa normal. No drainage or sinus tenderness.  Throat: lips, mucosa, and tongue normal; teeth and gums normal  Neck: no adenopathy, no carotid bruit, no JVD, supple, symmetrical, trachea midline and thyroid not enlarged, symmetric, no  tenderness/mass/nodules  No CVA tenderness.  Lungs: clear to auscultation bilaterally  Breasts: normal appearance, no masses or tenderness Heart: regular rate and rhythm, S1, S2 normal Abdomen: soft, non-tender; bowel sounds normal; no masses, no organomegaly  Musculoskeletal: ROM normal in all joints, no crepitus, no deformity, Normal muscle strengthen. Back  is symmetric, no curvature. Skin: Skin color, texture, turgor normal. No rashes or lesions  Lymph nodes: Cervical, supraclavicular, and axillary nodes normal.  Neurologic: CN 2 -12 Normal, Normal symmetric reflexes. Normal coordination and gait  Psych: Alert, Mood appear stable.    Assessment:    Annual wellness medicare exam   Plan:    During the course of the visit the patient was educated and counseled about appropriate screening and preventive services including:   Annual flu vaccine     Patient Instructions (the written plan) was given to the patient.  Medicare Attestation  I have personally reviewed:  The patient's medical and social history  Their use of alcohol, tobacco or illicit drugs  Their current medications and supplements  The patient's functional ability including ADLs,fall risks, home safety risks, cognitive, and hearing and visual impairment  Diet and physical activities  Evidence for depression or mood disorders  The patient's weight, height, BMI, and visual acuity have been recorded in the chart. I have made referrals, counseling, and provided education to the patient based on review of the above and I have provided the patient with a written personalized care plan for preventive services.

## 2019-05-06 LAB — URINE CULTURE
MICRO NUMBER:: 607438
Result:: NO GROWTH
SPECIMEN QUALITY:: ADEQUATE

## 2019-05-09 ENCOUNTER — Telehealth: Payer: Self-pay | Admitting: Internal Medicine

## 2019-05-09 NOTE — Telephone Encounter (Addendum)
Spoke with Fraser Din was closer to the top of the list that the social worker would be in contact to come out and do an evaluation and get her set up for the meals on wheels program. Maureen Chatters back and let her know that Dr Renold Genta also suggested calling [employer]

## 2019-05-09 NOTE — Telephone Encounter (Signed)
Please call daughter. She can contact [employer] and see if something else is available

## 2019-05-09 NOTE — Telephone Encounter (Signed)
[place] [address] with Delfino Lovett at [facility] on [address], gave him Nzinga's information, he put her on the wait list, took down all of her information. He did say there is a about a 6 month wait list, however with the [place] going on they are getting more funding and that could be moved up. Once they are closer to her being able to receive the meals on wheels, they will send a social worker out and evaluate and set up the meals on wheels.

## 2019-06-16 ENCOUNTER — Ambulatory Visit: Payer: Medicare Other | Admitting: Internal Medicine

## 2019-06-17 DIAGNOSIS — Z20828 Contact with and (suspected) exposure to other viral communicable diseases: Secondary | ICD-10-CM | POA: Diagnosis not present

## 2019-07-04 ENCOUNTER — Encounter: Payer: Self-pay | Admitting: Internal Medicine

## 2019-07-04 ENCOUNTER — Ambulatory Visit (INDEPENDENT_AMBULATORY_CARE_PROVIDER_SITE_OTHER): Payer: Medicare Other | Admitting: Internal Medicine

## 2019-07-04 ENCOUNTER — Other Ambulatory Visit: Payer: Self-pay

## 2019-07-04 VITALS — BP 130/70 | HR 82 | Temp 98.4°F | Ht 59.0 in | Wt 129.0 lb

## 2019-07-04 DIAGNOSIS — R413 Other amnesia: Secondary | ICD-10-CM | POA: Diagnosis not present

## 2019-07-04 DIAGNOSIS — Z23 Encounter for immunization: Secondary | ICD-10-CM

## 2019-07-04 DIAGNOSIS — R634 Abnormal weight loss: Secondary | ICD-10-CM | POA: Diagnosis not present

## 2019-07-04 DIAGNOSIS — M65351 Trigger finger, right little finger: Secondary | ICD-10-CM

## 2019-07-04 MED ORDER — DONEPEZIL HCL 5 MG PO TABS: 5.0000 mg | ORAL_TABLET | Freq: Every day | ORAL | 2 refills | Status: DC

## 2019-07-04 NOTE — Patient Instructions (Addendum)
Referral to hand surgeon for trigger finger. Continue Ensure. Weigh once weekly. RTC June for CPE.  Flu vaccine given today

## 2019-07-04 NOTE — Progress Notes (Signed)
   Subjective:    Patient ID: Amber Chang, female    DOB: 12-01-1926, 85 y.o.   MRN: [id number]  HPI Pleasant quiet 85 year old Female in with her daughter today for follow-up on weight loss.  She is now drinking 1 Ensure daily and is gained 2 pounds.  Daughter weighs her every week.  She will continue with that and patient will continue with 1 chocolate Ensure daily.  Patient resides with her daughter. She requests flu vaccine today  New issue is right fifth finger that appears to be a trigger finger   Review of Systems memory issues stable on low-dose Aricept without side effects     Objective:   Physical Exam Osteoarthritis of hands with Heberden's and Bouchard's nodes present.  Right fifth finger gets stuck in flexion and is slow to release consistent with trigger finger.  She is pleasant and cooperative.  Vital signs are stable.       Assessment & Plan:  Right fifth trigger finger-refer to hand surgeon  Memory loss treated with low-dose Aricept and stable  Weight loss-she has gained 2 pounds drinking 1 Ensure daily and eating regular food otherwise.  Continue to weigh her weekly.  Daughter will call me if any problems or concerns  Health maintenance-need for flu vaccine given today  25 minutes spent with patient and daughter.

## 2019-08-02 ENCOUNTER — Telehealth: Payer: Self-pay | Admitting: Internal Medicine

## 2019-08-02 MED ORDER — AMLODIPINE BESYLATE 10 MG PO TABS: 10.0000 mg | ORAL_TABLET | Freq: Every day | ORAL | 1 refills | Status: DC

## 2019-08-02 NOTE — Telephone Encounter (Signed)
Fax received from pharmacy [facility], [address], [address] Amlodipine Besylate 10mg  tablets which was last filled 05/04/19 and Metformin 500mg  tablets which was last filled 05/04/19 Last OV: 07/04/19 Last CPE: 05/05/19 Next CPE Due: 05/05/20

## 2019-08-02 NOTE — Addendum Note (Signed)
Addended by: Mady Haagensen on: 08/02/2019 10:03 AM   Modules accepted: Orders

## 2019-09-19 ENCOUNTER — Other Ambulatory Visit: Payer: Self-pay

## 2019-09-19 DIAGNOSIS — E119 Type 2 diabetes mellitus without complications: Secondary | ICD-10-CM

## 2019-09-19 MED ORDER — METFORMIN HCL 500 MG PO TABS: 500.0000 mg | ORAL_TABLET | Freq: Two times a day (BID) | ORAL | 3 refills | Status: DC

## 2019-09-21 ENCOUNTER — Other Ambulatory Visit: Payer: Self-pay | Admitting: Internal Medicine

## 2019-09-27 DIAGNOSIS — H401133 Primary open-angle glaucoma, bilateral, severe stage: Secondary | ICD-10-CM | POA: Diagnosis not present

## 2019-10-10 ENCOUNTER — Telehealth: Payer: Self-pay | Admitting: Internal Medicine

## 2019-10-10 MED ORDER — DONEPEZIL HCL 5 MG PO TABS: 5.0000 mg | ORAL_TABLET | Freq: Every day | ORAL | 2 refills | Status: DC

## 2019-10-10 NOTE — Addendum Note (Signed)
Addended by: Mady Haagensen on: 10/10/2019 10:47 AM   Modules accepted: Orders

## 2019-10-10 NOTE — Telephone Encounter (Signed)
Received Fax RX request from  [facility] [place], [place] [phone] (Phone) [phone] (Fax)     Medication - donepezil (ARICEPT) 5 MG tablet    Last Refill - 09/10/19  Last OV - 07/04/19  Last CPE - 05/05/19  Next Appointment -

## 2019-11-22 ENCOUNTER — Other Ambulatory Visit: Payer: Self-pay | Admitting: Internal Medicine

## 2019-11-29 ENCOUNTER — Ambulatory Visit: Payer: Medicare Other | Attending: Internal Medicine

## 2019-11-29 DIAGNOSIS — Z23 Encounter for immunization: Secondary | ICD-10-CM | POA: Diagnosis not present

## 2019-11-29 NOTE — Progress Notes (Signed)
   Covid-19 Vaccination Clinic  Name:  Amber Chang    MRN: [id number] DOB: 1927-08-04  11/29/2019  Amber Chang was observed post Covid-19 immunization for 15 minutes without incidence. She was provided with Vaccine Information Sheet and instruction to access the V-Safe system.   Amber Chang was instructed to call 911 with any severe reactions post vaccine: Marland Kitchen Difficulty breathing  . Swelling of your face and throat  . A fast heartbeat  . A bad rash all over your body  . Dizziness and weakness    Immunizations Administered    Name Date Dose VIS Date Route   Pfizer COVID-19 Vaccine 11/29/2019  9:19 AM 0.3 mL 10/21/2019 Intramuscular   Manufacturer: [employer], [employer]   Lot: [id number]   [place]: [id number]

## 2019-12-20 ENCOUNTER — Ambulatory Visit: Payer: Medicare Other | Attending: Internal Medicine

## 2019-12-20 DIAGNOSIS — Z23 Encounter for immunization: Secondary | ICD-10-CM | POA: Insufficient documentation

## 2019-12-20 NOTE — Progress Notes (Signed)
   Covid-19 Vaccination Clinic  Name:  Amber Chang    MRN: [id number] DOB: 12-Aug-1927  12/20/2019  [name] was observed post Covid-19 immunization for 15 minutes without incidence. She was provided with Vaccine Information Sheet and instruction to access the V-Safe system.   [name] was instructed to call 911 with any severe reactions post vaccine: Marland Kitchen Difficulty breathing  . Swelling of your face and throat  . A fast heartbeat  . A bad rash all over your body  . Dizziness and weakness    Immunizations Administered    Name Date Dose VIS Date Route   Pfizer COVID-19 Vaccine 12/20/2019  8:42 AM 0.3 mL 10/21/2019 Intramuscular   Manufacturer: [place]   Lot: [id number]   Guys: [id number]

## 2020-01-23 ENCOUNTER — Other Ambulatory Visit: Payer: Self-pay

## 2020-01-23 MED ORDER — METOPROLOL SUCCINATE ER 100 MG PO TB24: ORAL_TABLET | ORAL | 5 refills | Status: DC

## 2020-01-30 ENCOUNTER — Other Ambulatory Visit: Payer: Self-pay

## 2020-01-30 MED ORDER — AMLODIPINE BESYLATE 10 MG PO TABS: 10.0000 mg | ORAL_TABLET | Freq: Every day | ORAL | 1 refills | Status: DC

## 2020-03-19 DIAGNOSIS — H401133 Primary open-angle glaucoma, bilateral, severe stage: Secondary | ICD-10-CM | POA: Diagnosis not present

## 2020-04-30 ENCOUNTER — Other Ambulatory Visit: Payer: Self-pay

## 2020-04-30 MED ORDER — ROSUVASTATIN CALCIUM 20 MG PO TABS: ORAL_TABLET | ORAL | 3 refills | Status: DC

## 2020-04-30 MED ORDER — CLOPIDOGREL BISULFATE 75 MG PO TABS: 75.0000 mg | ORAL_TABLET | Freq: Every day | ORAL | 3 refills | Status: DC

## 2020-05-03 ENCOUNTER — Other Ambulatory Visit: Payer: Self-pay

## 2020-05-03 ENCOUNTER — Other Ambulatory Visit: Payer: Medicare Other | Admitting: Internal Medicine

## 2020-05-03 DIAGNOSIS — Z1329 Encounter for screening for other suspected endocrine disorder: Secondary | ICD-10-CM | POA: Diagnosis not present

## 2020-05-03 DIAGNOSIS — I10 Essential (primary) hypertension: Secondary | ICD-10-CM | POA: Diagnosis not present

## 2020-05-03 DIAGNOSIS — Z0000 Encounter for general adult medical examination without abnormal findings: Secondary | ICD-10-CM | POA: Diagnosis not present

## 2020-05-03 DIAGNOSIS — E782 Mixed hyperlipidemia: Secondary | ICD-10-CM | POA: Diagnosis not present

## 2020-05-03 DIAGNOSIS — E559 Vitamin D deficiency, unspecified: Secondary | ICD-10-CM | POA: Diagnosis not present

## 2020-05-03 DIAGNOSIS — E119 Type 2 diabetes mellitus without complications: Secondary | ICD-10-CM | POA: Diagnosis not present

## 2020-05-04 LAB — COMPLETE METABOLIC PANEL WITH GFR
AG Ratio: 1.6 (calc) (ref 1.0–2.5)
ALT: 16 U/L (ref 6–29)
AST: 17 U/L (ref 10–35)
Albumin: 4.2 g/dL (ref 3.6–5.1)
Alkaline phosphatase (APISO): 64 U/L (ref 37–153)
BUN: 10 mg/dL (ref 7–25)
CO2: 31 mmol/L (ref 20–32)
Calcium: 9.7 mg/dL (ref 8.6–10.4)
Chloride: 96 mmol/L — ABNORMAL LOW (ref 98–110)
Creat: 0.75 mg/dL (ref 0.60–0.88)
GFR, Est African American: 81 mL/min/{1.73_m2} (ref >=60)
GFR, Est Non African American: 70 mL/min/{1.73_m2} (ref >=60)
Globulin: 2.7 g/dL (calc) (ref 1.9–3.7)
Glucose, Bld: 126 mg/dL — ABNORMAL HIGH (ref 65–99)
Potassium: 4.2 mmol/L (ref 3.5–5.3)
Sodium: 134 mmol/L — ABNORMAL LOW (ref 135–146)
Total Bilirubin: 0.7 mg/dL (ref 0.2–1.2)
Total Protein: 6.9 g/dL (ref 6.1–8.1)

## 2020-05-04 LAB — CBC WITH DIFFERENTIAL/PLATELET
Absolute Monocytes: 582 cells/uL (ref 200–950)
Basophils Absolute: 43 cells/uL (ref 0–200)
Basophils Relative: 0.6 %
Eosinophils Absolute: 241 cells/uL (ref 15–500)
Eosinophils Relative: 3.4 %
HCT: 39.1 % (ref 35.0–45.0)
Hemoglobin: 12.5 g/dL (ref 11.7–15.5)
Lymphs Abs: 1590 cells/uL (ref 850–3900)
MCH: 30 pg (ref 27.0–33.0)
MCHC: 32 g/dL (ref 32.0–36.0)
MCV: 94 fL (ref 80.0–100.0)
MPV: 8.8 fL (ref 7.5–12.5)
Monocytes Relative: 8.2 %
Neutro Abs: 4643 cells/uL (ref 1500–7800)
Neutrophils Relative %: 65.4 %
Platelets: 301 10*3/uL (ref 140–400)
RBC: 4.16 10*6/uL (ref 3.80–5.10)
RDW: 10.7 % — ABNORMAL LOW (ref 11.0–15.0)
Total Lymphocyte: 22.4 %
WBC: 7.1 10*3/uL (ref 3.8–10.8)

## 2020-05-04 LAB — HEMOGLOBIN A1C
Hgb A1c MFr Bld: 4.8 % of total Hgb (ref <5.7)
Mean Plasma Glucose: 91 (calc)
eAG (mmol/L): 5 (calc)

## 2020-05-04 LAB — LIPID PANEL
Cholesterol: 131 mg/dL (ref <200)
HDL: 52 mg/dL (ref >=50)
LDL Cholesterol (Calc): 61 mg/dL (calc)
Non-HDL Cholesterol (Calc): 79 mg/dL (calc) (ref <130)
Total CHOL/HDL Ratio: 2.5 (calc) (ref <5.0)
Triglycerides: 97 mg/dL (ref <150)

## 2020-05-04 LAB — TSH: TSH: 1.27 mIU/L (ref 0.40–4.50)

## 2020-05-08 ENCOUNTER — Encounter: Payer: Self-pay | Admitting: Internal Medicine

## 2020-05-08 ENCOUNTER — Ambulatory Visit (INDEPENDENT_AMBULATORY_CARE_PROVIDER_SITE_OTHER): Payer: Medicare Other | Admitting: Internal Medicine

## 2020-05-08 ENCOUNTER — Other Ambulatory Visit: Payer: Self-pay

## 2020-05-08 VITALS — BP 120/70 | HR 83 | Ht 59.0 in | Wt 132.0 lb

## 2020-05-08 DIAGNOSIS — H409 Unspecified glaucoma: Secondary | ICD-10-CM

## 2020-05-08 DIAGNOSIS — R609 Edema, unspecified: Secondary | ICD-10-CM

## 2020-05-08 DIAGNOSIS — M65351 Trigger finger, right little finger: Secondary | ICD-10-CM | POA: Diagnosis not present

## 2020-05-08 DIAGNOSIS — Z7901 Long term (current) use of anticoagulants: Secondary | ICD-10-CM

## 2020-05-08 DIAGNOSIS — M24541 Contracture, right hand: Secondary | ICD-10-CM

## 2020-05-08 DIAGNOSIS — E782 Mixed hyperlipidemia: Secondary | ICD-10-CM

## 2020-05-08 DIAGNOSIS — Z0000 Encounter for general adult medical examination without abnormal findings: Secondary | ICD-10-CM

## 2020-05-08 DIAGNOSIS — G3184 Mild cognitive impairment, so stated: Secondary | ICD-10-CM

## 2020-05-08 DIAGNOSIS — E119 Type 2 diabetes mellitus without complications: Secondary | ICD-10-CM

## 2020-05-08 DIAGNOSIS — Z8673 Personal history of transient ischemic attack (TIA), and cerebral infarction without residual deficits: Secondary | ICD-10-CM

## 2020-05-08 DIAGNOSIS — E785 Hyperlipidemia, unspecified: Secondary | ICD-10-CM

## 2020-05-08 LAB — POCT URINALYSIS DIPSTICK
Appearance: NEGATIVE
Bilirubin, UA: NEGATIVE
Blood, UA: NEGATIVE
Glucose, UA: NEGATIVE
Ketones, UA: NEGATIVE
Leukocytes, UA: NEGATIVE
Nitrite, UA: NEGATIVE
Odor: NEGATIVE
Protein, UA: POSITIVE — AB
Spec Grav, UA: 1.015 (ref 1.010–1.025)
Urobilinogen, UA: 0.2 E.U./dL
pH, UA: 6.5 (ref 5.0–8.0)

## 2020-05-08 MED ORDER — PRODIGY AUTOCODE BLOOD GLUCOSE W/DEVICE KIT: PACK | 11 refills | Status: DC

## 2020-05-08 NOTE — Patient Instructions (Addendum)
Referral to hand surgeon regarding contracture and trigger finger right fifth finger.  Continue current medications and follow-up in 6 months.  Was a pleasure to see you today.

## 2020-05-08 NOTE — Progress Notes (Signed)
Subjective:    Patient ID: Amber Chang, female    DOB: October 22, 1927, 86 y.o.   MRN: [id number]  HPI 86 year old Female for Medicare wellness, health maintenance exam and evaluation of multiple medical issues.  She is accompanied by her daughter, Amber Chang, who is also a patient here.  Patient resides with her daughter.  Patient has history of essential hypertension, hyperlipidemia and controlled type 2 diabetes mellitus.  History of allergic rhinitis treated with Zyrtec.  She is allergic to Penicillin-causes hives.  Ms. Revels previously resided in [place] in [place].  She moved here in January 2017 to be with her daughter.  Her daughter is a widow.  They get along well.  She had an echocardiogram in [place] in 2014 that showed mild diastolic dysfunction with an ejection fraction of 64%.  She has a history of stroke in the remote past but it is not clear from old records when that occurred.  She has been maintained on Plavix and aspirin.  History of glaucoma and has 2 different types of eyedrops she uses.  Ophthalmologist is [facility].  Old records indicate she had carotid Dopplers in 2014 after episode of syncope.  She had minimal calcified plaque in the proximal left internal carotid artery.  Right carotid was normal.  She had 2D echocardiogram showing mild diastolic dysfunction.  EKG done February 2014 was normal.  Her weight is stable at 132 pounds.  In 2020 she weighed 127 pounds.  Last year she was placed on Aricept 5 mg daily for memory loss.  Social history: Patient retired from [employer], [place].  Her husband was a Administrator but he passed away.  She works as a Programmer, applications.  She does not smoke or consume alcohol.  She watches her sweets but likes pound cake.  Hemoglobin A1c is excellent at 4.8%.  Lipid panel is normal.  Fasting glucose is 126.  This seems incongruent with  an A1c of 4.8% so she may not exactly have been fasting.  BUN and creatinine are normal.  TSH is normal.  CBC is within normal limits.  She has some mild proteinuria but urine dipstick is normal. Review of Systems -new complaint is contracture of right fifth MCP joint and trigger finger right.  PIP joint and will be referred to hand surgeon.     Objective:   Physical Exam Blood pressure 120/70 pulse 83 pulse oximetry 95% weight 132 pounds height 4 feet 11 inches BMI 26.66  Skin warm and dry.  Nodes none.  TMs are clear.  Neck is supple without JVD thyromegaly or carotid bruits.  Chest clear.  Cardiac exam regular rate and rhythm normal S1 and S2.  Breast without masses.  Abdomen is soft nondistended without hepatosplenomegaly masses or tenderness.  Bimanual exam is normal.  She has trace lower extremity edema that is nonpitting.  She has contracture of the right fifth MCP joint and a trigger finger of the right.  PIP joint.  With regard to memory-she knows is the President and that Trump was the former president.  Some confusion with day of week.  Overall doing well and will continue with low-dose Aricept.       Assessment & Plan:  Mild memory loss-continue Aricept  History of diabetes but normal hemoglobin A1c  Hyperlipidemia-stable  History of glaucoma treated with eyedrops by [facility] ophthalmology  Blood pressure stable on current regimen  Contracture of right  fifth finger and trigger finger right fifth finger-refer to hand surgeon  Plan: She will return in 6 months.  No change in medications.  Subjective:   Patient presents for Medicare Annual/Subsequent preventive examination.  Review Past Medical/Family/Social:   Risk Factors  Current exercise habits:  Dietary issues discussed:   Cardiac risk factors:  Depression Screen  (Note: if answer to either of the following is "Yes", a more complete depression screening is indicated)   Over the past two weeks, have you  felt down, depressed or hopeless? No  Over the past two weeks, have you felt little interest or pleasure in doing things? No Have you lost interest or pleasure in daily life? No Do you often feel hopeless? No Do you cry easily over simple problems? No   Activities of Daily Living  In your present state of health, do you have any difficulty performing the following activities?:   Driving?  Does not drive Managing money?  Daughter helps her with these Feeding yourself?  Sometimes Getting from bed to chair?  Sometimes Climbing a flight of stairs?  Needs some assistance Preparing food and eating?:  Does not cook-daughter provides meals Bathing or showering?  Needs assistance Getting dressed: Needs assistance Getting to the toilet?  Needs assistance Using the toilet:No  Moving around from place to place: Yes -some assistance In the past year have you fallen or had a near fall?:No  Are you sexually active? No  Do you have more than one partner? No   Hearing Difficulties: No  Do you often ask people to speak up or repeat themselves?  Sometimes Do you experience ringing or noises in your ears?  No Do you have difficulty understanding soft or whispered voices?  Yes  Do you feel that you have a problem with memory? No Do you often misplace items?  Yes   Home Safety:  Do you have a smoke alarm at your residence? Yes Do you have grab bars in the bathroom?  No Do you have throw rugs in your house?  None   Cognitive Testing  Alert? Yes Normal Appearance?Yes  Oriented to person? Yes Place? Yes  Time? Yes  Recall of three objects? Yes  Can perform simple calculations? Yes  Displays appropriate judgment?Yes  Can read the correct time from a watch face?Yes   List the Names of Other Physician/Practitioners you currently use:  See referral list for the physicians patient is currently seeing.   No specialist at the present time  Review of Systems: See above   Objective:      General appearance: Appears younger than stated age Head: Normocephalic, without obvious abnormality, atraumatic  Eyes: conj clear, EOMi PEERLA  Ears: normal TM's and external ear canals both ears  Nose: Nares normal. Septum midline. Mucosa normal. No drainage or sinus tenderness.   Neck: no adenopathy, no carotid bruit, no JVD, supple, symmetrical, trachea midline and thyroid not enlarged, symmetric, no tenderness/mass/nodules  No CVA tenderness.  Lungs: clear to auscultation bilaterally  Breasts: normal appearance, no masses or tenderness, top of the pacemaker on left upper chest. Incision well-healed. It is tender.  Heart: regular rate and rhythm, S1, S2 normal, no murmur, click, rub or gallop  Abdomen: soft, non-tender; bowel sounds normal; no masses, no organomegaly  Musculoskeletal: Right fifth trigger finger PIP joint and contracture right fifth MCP joint Skin: Skin color, texture, turgor normal. No rashes or lesions  Lymph nodes: Cervical, supraclavicular, and axillary nodes normal.  Neurologic: CN 2 -12  Normal, Normal symmetric reflexes. Normal coordination and gait  Psych: Alert & Oriented x 3, Mood appear stable.    Assessment:    Annual wellness medicare exam   Plan:    During the course of the visit the patient was educated and counseled about appropriate screening and preventive services including:   Annual flu vaccine  Pneumonia vaccines up-to-date  Has had 2 COVID-19 immunizations     Patient Instructions (the written plan) was given to the patient.  Medicare Attestation  I have personally reviewed:  The patient's medical and social history  Their use of alcohol, tobacco or illicit drugs  Their current medications and supplements  The patient's functional ability including ADLs,fall risks, home safety risks, cognitive, and hearing and visual impairment  Diet and physical activities  Evidence for depression or mood disorders  The patient's weight,  height, BMI, and visual acuity have been recorded in the chart. I have made referrals, counseling, and provided education to the patient based on review of the above and I have provided the patient with a written personalized care plan for preventive services.

## 2020-06-01 DIAGNOSIS — S66811A Strain of other specified muscles, fascia and tendons at wrist and hand level, right hand, initial encounter: Secondary | ICD-10-CM | POA: Diagnosis not present

## 2020-06-01 DIAGNOSIS — M65351 Trigger finger, right little finger: Secondary | ICD-10-CM | POA: Diagnosis not present

## 2020-06-19 DIAGNOSIS — H26492 Other secondary cataract, left eye: Secondary | ICD-10-CM | POA: Diagnosis not present

## 2020-06-19 DIAGNOSIS — H401133 Primary open-angle glaucoma, bilateral, severe stage: Secondary | ICD-10-CM | POA: Diagnosis not present

## 2020-07-02 DIAGNOSIS — M65351 Trigger finger, right little finger: Secondary | ICD-10-CM | POA: Diagnosis not present

## 2020-07-02 DIAGNOSIS — S66811A Strain of other specified muscles, fascia and tendons at wrist and hand level, right hand, initial encounter: Secondary | ICD-10-CM | POA: Diagnosis not present

## 2020-07-27 ENCOUNTER — Other Ambulatory Visit: Payer: Self-pay | Admitting: Internal Medicine

## 2020-07-27 DIAGNOSIS — E119 Type 2 diabetes mellitus without complications: Secondary | ICD-10-CM

## 2020-08-08 ENCOUNTER — Other Ambulatory Visit: Payer: Self-pay | Admitting: Internal Medicine

## 2020-08-13 DIAGNOSIS — S66811D Strain of other specified muscles, fascia and tendons at wrist and hand level, right hand, subsequent encounter: Secondary | ICD-10-CM | POA: Diagnosis not present

## 2020-08-13 DIAGNOSIS — S66811A Strain of other specified muscles, fascia and tendons at wrist and hand level, right hand, initial encounter: Secondary | ICD-10-CM | POA: Diagnosis not present

## 2020-08-13 DIAGNOSIS — M79644 Pain in right finger(s): Secondary | ICD-10-CM | POA: Diagnosis not present

## 2020-08-14 ENCOUNTER — Ambulatory Visit: Payer: Medicare Other | Attending: Internal Medicine

## 2020-08-14 DIAGNOSIS — Z23 Encounter for immunization: Secondary | ICD-10-CM

## 2020-08-14 NOTE — Progress Notes (Signed)
   Covid-19 Vaccination Clinic  Name:  Amber Chang    MRN: [id number] DOB: [birth year]  08/14/2020  [name] was observed post Covid-19 immunization for 15 minutes without incident. She was provided with Vaccine Information Sheet and instruction to access the V-Safe system.   [name] was instructed to call 911 with any severe reactions post vaccine: Marland Kitchen Difficulty breathing  . Swelling of face and throat  . A fast heartbeat  . A bad rash all over body  . Dizziness and weakness

## 2020-08-29 DIAGNOSIS — M79644 Pain in right finger(s): Secondary | ICD-10-CM | POA: Diagnosis not present

## 2020-08-29 DIAGNOSIS — S66811D Strain of other specified muscles, fascia and tendons at wrist and hand level, right hand, subsequent encounter: Secondary | ICD-10-CM | POA: Diagnosis not present

## 2020-09-10 DIAGNOSIS — S66811A Strain of other specified muscles, fascia and tendons at wrist and hand level, right hand, initial encounter: Secondary | ICD-10-CM | POA: Diagnosis not present

## 2020-09-10 DIAGNOSIS — S66811D Strain of other specified muscles, fascia and tendons at wrist and hand level, right hand, subsequent encounter: Secondary | ICD-10-CM | POA: Diagnosis not present

## 2020-09-10 DIAGNOSIS — M79644 Pain in right finger(s): Secondary | ICD-10-CM | POA: Diagnosis not present

## 2020-09-19 ENCOUNTER — Encounter: Payer: Self-pay | Admitting: Internal Medicine

## 2020-09-19 ENCOUNTER — Ambulatory Visit (INDEPENDENT_AMBULATORY_CARE_PROVIDER_SITE_OTHER): Payer: Medicare Other | Admitting: Internal Medicine

## 2020-09-19 ENCOUNTER — Other Ambulatory Visit: Payer: Self-pay

## 2020-09-19 VITALS — Ht 59.0 in | Wt 132.0 lb

## 2020-09-19 DIAGNOSIS — Z23 Encounter for immunization: Secondary | ICD-10-CM

## 2020-09-19 NOTE — Patient Instructions (Signed)
Patient received a flu vaccine IM L deltoid, AV, CMA  

## 2020-09-23 NOTE — Progress Notes (Signed)
Flu vaccine per CMA 

## 2020-10-15 DIAGNOSIS — S66811A Strain of other specified muscles, fascia and tendons at wrist and hand level, right hand, initial encounter: Secondary | ICD-10-CM | POA: Diagnosis not present

## 2020-11-01 ENCOUNTER — Other Ambulatory Visit: Payer: Medicare Other | Admitting: Internal Medicine

## 2020-11-01 ENCOUNTER — Other Ambulatory Visit: Payer: Self-pay

## 2020-11-01 DIAGNOSIS — E785 Hyperlipidemia, unspecified: Secondary | ICD-10-CM

## 2020-11-01 DIAGNOSIS — E1169 Type 2 diabetes mellitus with other specified complication: Secondary | ICD-10-CM | POA: Diagnosis not present

## 2020-11-01 DIAGNOSIS — E119 Type 2 diabetes mellitus without complications: Secondary | ICD-10-CM | POA: Diagnosis not present

## 2020-11-02 LAB — LIPID PANEL
Cholesterol: 144 mg/dL (ref <200)
HDL: 57 mg/dL (ref >=50)
LDL Cholesterol (Calc): 69 mg/dL (calc)
Non-HDL Cholesterol (Calc): 87 mg/dL (calc) (ref <130)
Total CHOL/HDL Ratio: 2.5 (calc) (ref <5.0)
Triglycerides: 94 mg/dL (ref <150)

## 2020-11-02 LAB — HEPATIC FUNCTION PANEL
AG Ratio: 1.7 (calc) (ref 1.0–2.5)
ALT: 18 U/L (ref 6–29)
AST: 19 U/L (ref 10–35)
Albumin: 4.3 g/dL (ref 3.6–5.1)
Alkaline phosphatase (APISO): 66 U/L (ref 37–153)
Bilirubin, Direct: 0.1 mg/dL (ref 0.0–0.2)
Globulin: 2.5 g/dL (calc) (ref 1.9–3.7)
Indirect Bilirubin: 0.4 mg/dL (calc) (ref 0.2–1.2)
Total Bilirubin: 0.5 mg/dL (ref 0.2–1.2)
Total Protein: 6.8 g/dL (ref 6.1–8.1)

## 2020-11-02 LAB — HEMOGLOBIN A1C
Hgb A1c MFr Bld: 5.1 % of total Hgb (ref <5.7)
Mean Plasma Glucose: 100 mg/dL
eAG (mmol/L): 5.5 mmol/L

## 2020-11-06 ENCOUNTER — Ambulatory Visit (INDEPENDENT_AMBULATORY_CARE_PROVIDER_SITE_OTHER): Payer: Medicare Other | Admitting: Internal Medicine

## 2020-11-06 ENCOUNTER — Other Ambulatory Visit: Payer: Self-pay

## 2020-11-06 ENCOUNTER — Encounter: Payer: Self-pay | Admitting: Internal Medicine

## 2020-11-06 VITALS — BP 140/80 | HR 84 | Ht 59.0 in | Wt 132.0 lb

## 2020-11-06 DIAGNOSIS — E1169 Type 2 diabetes mellitus with other specified complication: Secondary | ICD-10-CM

## 2020-11-06 DIAGNOSIS — H409 Unspecified glaucoma: Secondary | ICD-10-CM | POA: Diagnosis not present

## 2020-11-06 DIAGNOSIS — R609 Edema, unspecified: Secondary | ICD-10-CM

## 2020-11-06 DIAGNOSIS — E785 Hyperlipidemia, unspecified: Secondary | ICD-10-CM

## 2020-11-06 DIAGNOSIS — R413 Other amnesia: Secondary | ICD-10-CM | POA: Diagnosis not present

## 2020-11-06 DIAGNOSIS — Z7901 Long term (current) use of anticoagulants: Secondary | ICD-10-CM

## 2020-11-06 DIAGNOSIS — Z8673 Personal history of transient ischemic attack (TIA), and cerebral infarction without residual deficits: Secondary | ICD-10-CM

## 2020-11-06 DIAGNOSIS — M24541 Contracture, right hand: Secondary | ICD-10-CM | POA: Diagnosis not present

## 2020-11-06 DIAGNOSIS — M65351 Trigger finger, right little finger: Secondary | ICD-10-CM

## 2020-11-06 DIAGNOSIS — E782 Mixed hyperlipidemia: Secondary | ICD-10-CM | POA: Diagnosis not present

## 2020-11-06 DIAGNOSIS — I509 Heart failure, unspecified: Secondary | ICD-10-CM

## 2020-11-06 MED ORDER — BENZONATATE 100 MG PO CAPS: 100.0000 mg | ORAL_CAPSULE | Freq: Three times a day (TID) | ORAL | 0 refills | Status: DC | PRN

## 2020-11-06 MED ORDER — ALBUTEROL SULFATE HFA 108 (90 BASE) MCG/ACT IN AERS: 2.0000 | INHALATION_SPRAY | Freq: Four times a day (QID) | RESPIRATORY_TRACT | 0 refills | Status: DC | PRN

## 2020-11-06 NOTE — Progress Notes (Signed)
   Subjective:    Patient ID: Amber Chang, female    DOB: Nov 02, 1927, 86 y.o.   MRN: [id number]  HPI 86 year old Female here with her daughter for 23-month recheck.  Patient doing well.  Daughter reports that patient is sometimes forgetful.  Patient has had 3 COVID-19 vaccines and flu vaccine.  Being followed by Dr. Merlyn Lot for subluxating extensor tendons right ring and small fingers.  Is now in a splint.  Daughter says this is working out fairly well and they are hesitant to do surgery.  She has a history of glaucoma.  She has a history of essential hypertension, controlled type 2 diabetes mellitus, hyperlipidemia, history of allergic rhinitis.  History of mild memory loss and is on Aricept.  Daughter is concerned about heart failure.  We did draw a BMP today.  However at this time I do not think she is in florid heart failure.  Her hemoglobin A1c is normal at 5.1%.  Liver functions are normal.  Lipid panel is completely normal.  She has had some mild cough.  Daughter is concerned about heart failure.  Review of Systems does not appear to have shortness of breath.  Denies chest pain.     Objective:   Physical Exam Blood pressure 140/80 pulse 84 pulse oximetry 95% weight 132 pounds height 4 feet 11 inches BMI 26.66  Skin warm and dry.  Neck is supple without JVD thyromegaly or carotid bruits.  Right hand is in a splint.  Chest is clear to auscultation.  Cardiac exam regular rate and rhythm normal S1 and S2 without murmurs or gallops.  No S3 noted.  Trace lower extremity pitting edema.  She has gained 5 pounds since health maintenance exam 6 months ago.  She is alert.  Not oriented to month day of week or year.  She is cooperative and moves all 4 extremities.     Assessment & Plan:  Essential hypertension-stable on current regimen of metoprolol, losartan, amlodipine  History of glaucoma treated with Xalatan  Type 2 diabetes mellitus-stable A1c treated with Metformin  Anticoagulation  with Plavix with remote history of stroke  Right fifth trigger finger treated by Dr. Merlyn Lot with splint  Right fourth finger contraction treated by Dr. Merlyn Lot was plan  History of mild memory loss treated with Aricept 5 mg at bedtime  History of stroke in the remote past treated with aspirin and Plavix  History of hyperlipidemia treated with statin medication and stable/  Fortunately she has gained weight-5 pounds since last office visit.  Cough-rule out congestive heart failure.  BNP ordered.  Treat with Tessalon Perles up to 3 times daily as needed for cough.  Her chest is clear.  Plan: Patient is to have chest x-ray and BNP.  Continue current medications and follow-up in 6 months.

## 2020-11-06 NOTE — Patient Instructions (Addendum)
Blood pressure is stable on current regimen of multiple drugs.  Continue anticoagulation with prior history of stroke.  Continue Aricept for mild memory loss.  Lipids are stable with statin medication.  Continue Metformin for diabetes mellitus.  Try Tessalon Perles for cough.  BNP ordered to check for heart failure.  Patient will have chest x-ray.  Return in 6 months.

## 2020-11-07 LAB — BRAIN NATRIURETIC PEPTIDE: Brain Natriuretic Peptide: 34 pg/mL (ref <100)

## 2020-11-08 ENCOUNTER — Other Ambulatory Visit: Payer: Self-pay | Admitting: Internal Medicine

## 2020-11-15 ENCOUNTER — Other Ambulatory Visit: Payer: Self-pay | Admitting: Internal Medicine

## 2020-12-20 ENCOUNTER — Encounter: Payer: Self-pay | Admitting: Internal Medicine

## 2020-12-20 ENCOUNTER — Telehealth: Payer: Self-pay | Admitting: Internal Medicine

## 2020-12-20 NOTE — Telephone Encounter (Signed)
Order written for 4 prong walker as requested by patient's daughter.

## 2021-01-23 ENCOUNTER — Other Ambulatory Visit: Payer: Self-pay | Admitting: Internal Medicine

## 2021-02-26 ENCOUNTER — Other Ambulatory Visit (HOSPITAL_BASED_OUTPATIENT_CLINIC_OR_DEPARTMENT_OTHER): Payer: Self-pay

## 2021-02-27 ENCOUNTER — Ambulatory Visit: Payer: Medicare Other | Attending: Internal Medicine

## 2021-02-27 ENCOUNTER — Other Ambulatory Visit (HOSPITAL_BASED_OUTPATIENT_CLINIC_OR_DEPARTMENT_OTHER): Payer: Self-pay

## 2021-02-27 ENCOUNTER — Other Ambulatory Visit: Payer: Self-pay | Admitting: Internal Medicine

## 2021-02-27 ENCOUNTER — Other Ambulatory Visit: Payer: Self-pay

## 2021-02-27 DIAGNOSIS — Z23 Encounter for immunization: Secondary | ICD-10-CM

## 2021-02-27 MED ORDER — PFIZER-BIONT COVID-19 VAC-TRIS 30 MCG/0.3ML IM SUSP
INTRAMUSCULAR | 0 refills | Status: DC
  Filled 2021-02-27: qty 0.3, 1d supply, fill #0

## 2021-02-27 NOTE — Progress Notes (Signed)
   Covid-19 Vaccination Clinic  Name:  Amber Chang    MRN: [id number] DOB: [birth year]  02/27/2021  Amber Chang was observed post Covid-19 immunization for 15 minutes without incident. She was provided with Vaccine Information Sheet and instruction to access the V-Safe system.   Amber Chang was instructed to call 911 with any severe reactions post vaccine: Marland Kitchen Difficulty breathing  . Swelling of face and throat  . A fast heartbeat  . A bad rash all over body  . Dizziness and weakness   Immunizations Administered    Name Date Dose VIS Date Route   PFIZER Comrnaty(Gray TOP) Covid-19 Vaccine 02/27/2021 11:29 AM 0.3 mL 10/18/2020 Intramuscular   Manufacturer: [employer]   Lot: [id number]   NDC: [phone]

## 2021-04-23 ENCOUNTER — Other Ambulatory Visit: Payer: Self-pay | Admitting: Internal Medicine

## 2021-05-07 ENCOUNTER — Other Ambulatory Visit: Payer: Self-pay

## 2021-05-07 ENCOUNTER — Other Ambulatory Visit: Payer: Medicare Other | Admitting: Internal Medicine

## 2021-05-07 DIAGNOSIS — Z23 Encounter for immunization: Secondary | ICD-10-CM

## 2021-05-07 DIAGNOSIS — E782 Mixed hyperlipidemia: Secondary | ICD-10-CM

## 2021-05-07 DIAGNOSIS — R413 Other amnesia: Secondary | ICD-10-CM

## 2021-05-07 DIAGNOSIS — Z0000 Encounter for general adult medical examination without abnormal findings: Secondary | ICD-10-CM

## 2021-05-07 DIAGNOSIS — I509 Heart failure, unspecified: Secondary | ICD-10-CM

## 2021-05-07 DIAGNOSIS — Z7901 Long term (current) use of anticoagulants: Secondary | ICD-10-CM

## 2021-05-07 DIAGNOSIS — M24541 Contracture, right hand: Secondary | ICD-10-CM

## 2021-05-07 DIAGNOSIS — H409 Unspecified glaucoma: Secondary | ICD-10-CM

## 2021-05-07 DIAGNOSIS — E119 Type 2 diabetes mellitus without complications: Secondary | ICD-10-CM

## 2021-05-08 LAB — COMPLETE METABOLIC PANEL WITH GFR
AG Ratio: 1.6 (calc) (ref 1.0–2.5)
ALT: 14 U/L (ref 6–29)
AST: 18 U/L (ref 10–35)
Albumin: 4 g/dL (ref 3.6–5.1)
Alkaline phosphatase (APISO): 56 U/L (ref 37–153)
BUN: 11 mg/dL (ref 7–25)
CO2: 30 mmol/L (ref 20–32)
Calcium: 9.5 mg/dL (ref 8.6–10.4)
Chloride: 100 mmol/L (ref 98–110)
Creat: 0.73 mg/dL (ref 0.60–0.88)
GFR, Est African American: 83 mL/min/{1.73_m2} (ref >=60)
GFR, Est Non African American: 72 mL/min/{1.73_m2} (ref >=60)
Globulin: 2.5 g/dL (calc) (ref 1.9–3.7)
Glucose, Bld: 114 mg/dL — ABNORMAL HIGH (ref 65–99)
Potassium: 3.9 mmol/L (ref 3.5–5.3)
Sodium: 139 mmol/L (ref 135–146)
Total Bilirubin: 0.6 mg/dL (ref 0.2–1.2)
Total Protein: 6.5 g/dL (ref 6.1–8.1)

## 2021-05-08 LAB — HEMOGLOBIN A1C
Hgb A1c MFr Bld: 4.9 % of total Hgb (ref <5.7)
Mean Plasma Glucose: 94 mg/dL
eAG (mmol/L): 5.2 mmol/L

## 2021-05-08 LAB — CBC WITH DIFFERENTIAL/PLATELET
Absolute Monocytes: 472 cells/uL (ref 200–950)
Basophils Absolute: 18 cells/uL (ref 0–200)
Basophils Relative: 0.3 %
Eosinophils Absolute: 271 cells/uL (ref 15–500)
Eosinophils Relative: 4.6 %
HCT: 38.3 % (ref 35.0–45.0)
Hemoglobin: 12.2 g/dL (ref 11.7–15.5)
Lymphs Abs: 1652 cells/uL (ref 850–3900)
MCH: 30.1 pg (ref 27.0–33.0)
MCHC: 31.9 g/dL — ABNORMAL LOW (ref 32.0–36.0)
MCV: 94.6 fL (ref 80.0–100.0)
MPV: 9.1 fL (ref 7.5–12.5)
Monocytes Relative: 8 %
Neutro Abs: 3487 cells/uL (ref 1500–7800)
Neutrophils Relative %: 59.1 %
Platelets: 298 10*3/uL (ref 140–400)
RBC: 4.05 10*6/uL (ref 3.80–5.10)
RDW: 11.1 % (ref 11.0–15.0)
Total Lymphocyte: 28 %
WBC: 5.9 10*3/uL (ref 3.8–10.8)

## 2021-05-08 LAB — LIPID PANEL
Cholesterol: 122 mg/dL (ref <200)
HDL: 50 mg/dL (ref >=50)
LDL Cholesterol (Calc): 55 mg/dL (calc)
Non-HDL Cholesterol (Calc): 72 mg/dL (calc) (ref <130)
Total CHOL/HDL Ratio: 2.4 (calc) (ref <5.0)
Triglycerides: 83 mg/dL (ref <150)

## 2021-05-08 LAB — TSH: TSH: 1.06 mIU/L (ref 0.40–4.50)

## 2021-05-10 ENCOUNTER — Encounter: Payer: Medicare Other | Admitting: Internal Medicine

## 2021-05-16 ENCOUNTER — Ambulatory Visit (INDEPENDENT_AMBULATORY_CARE_PROVIDER_SITE_OTHER): Payer: Medicare Other | Admitting: Internal Medicine

## 2021-05-16 ENCOUNTER — Other Ambulatory Visit: Payer: Self-pay

## 2021-05-16 ENCOUNTER — Encounter: Payer: Self-pay | Admitting: Internal Medicine

## 2021-05-16 VITALS — BP 120/80 | HR 84 | Ht 59.0 in | Wt 130.0 lb

## 2021-05-16 DIAGNOSIS — R609 Edema, unspecified: Secondary | ICD-10-CM | POA: Diagnosis not present

## 2021-05-16 DIAGNOSIS — E1169 Type 2 diabetes mellitus with other specified complication: Secondary | ICD-10-CM

## 2021-05-16 DIAGNOSIS — E782 Mixed hyperlipidemia: Secondary | ICD-10-CM | POA: Diagnosis not present

## 2021-05-16 DIAGNOSIS — H409 Unspecified glaucoma: Secondary | ICD-10-CM

## 2021-05-16 DIAGNOSIS — E785 Hyperlipidemia, unspecified: Secondary | ICD-10-CM

## 2021-05-16 DIAGNOSIS — R8290 Unspecified abnormal findings in urine: Secondary | ICD-10-CM

## 2021-05-16 DIAGNOSIS — Z0000 Encounter for general adult medical examination without abnormal findings: Secondary | ICD-10-CM | POA: Diagnosis not present

## 2021-05-16 DIAGNOSIS — Z8673 Personal history of transient ischemic attack (TIA), and cerebral infarction without residual deficits: Secondary | ICD-10-CM

## 2021-05-16 DIAGNOSIS — R413 Other amnesia: Secondary | ICD-10-CM

## 2021-05-16 DIAGNOSIS — Z7901 Long term (current) use of anticoagulants: Secondary | ICD-10-CM

## 2021-05-16 DIAGNOSIS — E119 Type 2 diabetes mellitus without complications: Secondary | ICD-10-CM

## 2021-05-16 DIAGNOSIS — M24541 Contracture, right hand: Secondary | ICD-10-CM

## 2021-05-16 DIAGNOSIS — M65351 Trigger finger, right little finger: Secondary | ICD-10-CM

## 2021-05-16 LAB — POCT URINALYSIS DIPSTICK
Bilirubin, UA: NEGATIVE
Glucose, UA: NEGATIVE
Ketones, UA: NEGATIVE
Nitrite, UA: NEGATIVE
Protein, UA: POSITIVE — AB
Spec Grav, UA: >=1.03 — AB (ref 1.010–1.025)
Urobilinogen, UA: 0.2 E.U./dL
pH, UA: 6.5 (ref 5.0–8.0)

## 2021-05-16 MED ORDER — BENZONATATE 100 MG PO CAPS: 100.0000 mg | ORAL_CAPSULE | Freq: Two times a day (BID) | ORAL | 5 refills | Status: DC | PRN

## 2021-05-16 NOTE — Progress Notes (Signed)
Subjective:    Patient ID: Amber Chang, female    DOB: 1927/01/23, 87 y.o.   MRN: [id number]  HPI 87 year old Female seen for Medicare wellness and health maintenance exam.  She is accompanied by her daughter today.  She has a history of essential hypertension, hyperlipidemia and controlled type 2 diabetes mellitus.  History of allergic rhinitis treated with Zyrtec.  She is allergic to Penicillin-it causes hives.  Amber Chang previously resided in [place] in [place].  She moved here in January 2017 to be with her daughter.  He had an echocardiogram in [place] in 2014 that showed mild diastolic dysfunction with an ejection fraction of 64%.  She has a history of stroke in the remote past but it is not clear from old records when that occurred.  She has been maintained on Plavix and aspirin.  History of glaucoma and has 2 different types of eyedrops she uses.  Ophthalmologist is [facility].  Old records indicate she had carotid Dopplers in 2014 after episode of syncope.  She had minimal calcified plaque in the proximal left internal carotid artery.  Right carotid was normal.  She had 2D echocardiogram showing mild diastolic dysfunction.  EKG done February 2014 was normal.  In 2020 she was placed on Aricept 5 mg daily for memory loss.  Social history: She retired from [employer], [place].  She is a widow.  Husband was a truck Geophysicist/field seismologist.  She used to work as a Programmer, applications.  She does not smoke or consume alcohol.        Review of Systems under treatment by Dr. Fredna Chang for subluxating extensor tendon right ring finger and small fingers with yoke splint which she wears permanently.  Appetite is fair according to her daughter.     Objective:   Physical Exam  Blood pressure 120/80 pulse 84 pulse oximetry 95% weight 130 pounds height 4 feet 11 inches BMI 26.26  Pleasant and alert in no acute  distress.  Skin: Warm and dry.  Nodes none.  TMs clear.  Neck supple.  Chest clear to auscultation without rales or wheezing.  Cardiac exam: Regular rate and rhythm normal S1 and S2.  Breasts are without masses.  Abdomen is soft nondistended without hepatosplenomegaly masses or tenderness.  Bimanual exam deferred.  No lower extremity pitting edema.  Neuro is intact without gross focal deficits.  She has some very mild memory loss.      Assessment & Plan:  Mild memory loss  Contracture right fifth MCP joint and trigger finger right PIP joint.  Wear splint device per Dr. Fredna Chang.  Does not have to have surgery.  History of type 2 diabetes mellitus treated with metformin and stable.  Hemoglobin A1c is excellent at 4.9%  Hyperlipidemia treated with Crestor 20 mg daily and lipid panel is normal as well as liver functions.  Essential hypertension treated with losartan and amlodipine as well as metoprolol and stable.  History of glaucoma treated with Combigan and Xalatan per ophthalmology  History of stroke in the remote past treated with Plavix and aspirin  Has occasional cough and Tessalon Perles have been prescribed for her to take on a as needed basis  Plan: Return in 6 months or as needed.   Subjective:   Patient presents for Medicare Annual/Subsequent preventive examination.  Review Past Medical/Family/Social: See above   Risk Factors  Current exercise habits: Sedentary Dietary issues discussed: Low-fat low carbohydrate  Cardiac risk factors: Hyperlipidemia, remote history of MI  Depression Screen  (Note: if answer to either of the following is "Yes", a more complete depression screening is indicated)   Over the past two weeks, have you felt down, depressed or hopeless? No  Over the past two weeks, have you felt little interest or pleasure in doing things? No Have you lost interest or pleasure in daily life? No Do you often feel hopeless? No Do you cry easily over simple  problems? No   Activities of Daily Living  In your present state of health, do you have any difficulty performing the following activities?:   Driving? No  Managing money?  Daughter helps her with this Feeding yourself?  Sometimes Getting from bed to chair?  Sometimes Climbing a flight of stairs?  Needs assistance Preparing food and eating?:  Daughter prepares meals Bathing or showering?  Needs help Getting dressed: Needs help Getting to the toilet?  Needs help Using the toilet:No  Moving around from place to place: Sometimes-order given for rolling walker In the past year have you fallen or had a near fall?:No  Are you sexually active? No  Do you have more than one partner? No   Hearing Difficulties:  Do you often ask people to speak up or repeat themselves?  Sometimes Do you experience ringing or noises in your ears? No  Do you have difficulty understanding soft or whispered voices?  Yes Do you feel that you have a problem with memory?  Sometimes Do you often misplace items?  Yes   Home Safety:  Do you have a smoke alarm at your residence? Yes Do you have grab bars in the bathroom?  No Do you have throw rugs in your house?  Noted   Cognitive Testing  Alert? Yes Normal Appearance?Yes  Oriented to person? Yes Place? Yes  Time?  Recall of three objects? Yes  Can perform simple calculations? Yes  Displays appropriate judgment?Yes  Can read the correct time from a watch face?Yes   List the Names of Other Physician/Practitioners you currently use:  See referral list for the physicians patient is currently seeing.  [facility]  Dr. Fredna Chang   Review of Systems: See above   Objective:     General appearance: Appears younger than stated age and is pleasant and cooperative Head: Normocephalic, without obvious abnormality, atraumatic  Eyes: conj clear, EOMi PEERLA  Ears: normal TM's and external ear canals both ears  Nose: Nares normal. Septum midline.  Mucosa normal. No drainage or sinus tenderness.  Throat: lips, mucosa, and tongue normal; teeth and gums normal  Neck: no adenopathy, no carotid bruit, no JVD, supple, symmetrical, trachea midline and thyroid not enlarged, symmetric, no tenderness/mass/nodules  No CVA tenderness.  Lungs: clear to auscultation bilaterally  Breasts: normal appearance, no masses or tenderness, top of the pacemaker on left upper chest. Incision well-healed. It is tender.  Heart: regular rate and rhythm, S1, S2 normal, no murmur, click, rub or gallop  Abdomen: soft, non-tender; bowel sounds normal; no masses, no organomegaly  Musculoskeletal: ROM normal in all joints, no crepitus, no deformity, Normal muscle strengthen. Back  is symmetric, no curvature. Skin: Skin color, texture, turgor normal. No rashes or lesions  Lymph nodes: Cervical, supraclavicular, and axillary nodes normal.  Neurologic: CN 2 -12 Normal, Normal symmetric reflexes. Normal coordination and gait  Psych: Alert & Oriented x 3, Mood appear stable.    Assessment:    Annual wellness medicare exam   Plan:  During the course of the visit the patient was educated and counseled about appropriate screening and preventive services including:   Last mammogram was 2018  Has had 3 COVID immunizations and should definitely have booster in the Fall as well as flu vaccine   Patient Instructions (the written plan) was given to the patient.  Medicare Attestation  I have personally reviewed:  The patient's medical and social history  Their use of alcohol, tobacco or illicit drugs  Their current medications and supplements  The patient's functional ability including ADLs,fall risks, home safety risks, cognitive, and hearing and visual impairment  Diet and physical activities  Evidence for depression or mood disorders  The patient's weight, height, BMI, and visual acuity have been recorded in the chart. I have made referrals, counseling, and provided  education to the patient based on review of the above and I have provided the patient with a written personalized care plan for preventive services.

## 2021-05-17 ENCOUNTER — Telehealth: Payer: Self-pay | Admitting: Internal Medicine

## 2021-05-17 LAB — URINE CULTURE
MICRO NUMBER:: 12092005
SPECIMEN QUALITY:: ADEQUATE

## 2021-05-17 LAB — URINALYSIS, MICROSCOPIC ONLY

## 2021-05-17 NOTE — Telephone Encounter (Signed)
[name] [phone]  When [name] went to get Joleigh the wheel chair they told her the 4 prong was too heavy for patient to lift that she would need a 2 prong, so now she needs a new prescription for that faxed to   [place]

## 2021-05-17 NOTE — Telephone Encounter (Signed)
Faxed order to [facility]

## 2021-06-14 ENCOUNTER — Encounter: Payer: Self-pay | Admitting: Podiatry

## 2021-06-14 ENCOUNTER — Other Ambulatory Visit: Payer: Self-pay

## 2021-06-14 ENCOUNTER — Ambulatory Visit (INDEPENDENT_AMBULATORY_CARE_PROVIDER_SITE_OTHER): Payer: Medicare Other | Admitting: Podiatry

## 2021-06-14 DIAGNOSIS — E119 Type 2 diabetes mellitus without complications: Secondary | ICD-10-CM | POA: Diagnosis not present

## 2021-06-14 DIAGNOSIS — M79674 Pain in right toe(s): Secondary | ICD-10-CM | POA: Insufficient documentation

## 2021-06-14 DIAGNOSIS — M79675 Pain in left toe(s): Secondary | ICD-10-CM | POA: Diagnosis not present

## 2021-06-14 DIAGNOSIS — B351 Tinea unguium: Secondary | ICD-10-CM

## 2021-06-14 NOTE — Progress Notes (Signed)
This patient returns to my office for at risk foot care.  This patient requires this care by a professional since this patient will be at risk due to having diabetes and coagulation defect.  Patient is taking plavix.  This patient is unable to cut nails herself since the patient cannot reach hiernails.These nails are painful walking and wearing shoes.  This patient presents for at risk foot care today.  General Appearance  Alert, conversant and in no acute stress.  Vascular  Dorsalis pedis and posterior tibial  pulses are palpable  bilaterally.  Capillary return is within normal limits  bilaterally. Temperature is within normal limits  bilaterally.  Neurologic  Senn-Weinstein monofilament wire test within normal limits  bilaterally. Muscle power within normal limits bilaterally.  Nails Thick disfigured discolored nails with subungual debris  from hallux to fifth toes bilaterally. No evidence of bacterial infection or drainage bilaterally.  Orthopedic  No limitations of motion  feet .  No crepitus or effusions noted.  No bony pathology or digital deformities noted.  Skin  normotropic skin with no porokeratosis noted bilaterally.  No signs of infections or ulcers noted.     Onychomycosis  Pain in right toes  Pain in left toes  Consent was obtained for treatment procedures.   Mechanical debridement of nails 1-5  bilaterally performed with a nail nipper.  Filed with dremel without incident.    Return office visit   4 months                   Told patient to return for periodic foot care and evaluation due to potential at risk complications.   Gardiner Barefoot DPM

## 2021-06-30 NOTE — Patient Instructions (Addendum)
It was a pleasure to see you today.  Continue current medications.  Order given for walker.  Return in 6 months.  Prescription for Tessalon Perles if needed for cough

## 2021-07-22 ENCOUNTER — Other Ambulatory Visit: Payer: Self-pay | Admitting: Internal Medicine

## 2021-07-22 DIAGNOSIS — E119 Type 2 diabetes mellitus without complications: Secondary | ICD-10-CM

## 2021-07-31 ENCOUNTER — Other Ambulatory Visit: Payer: Self-pay | Admitting: Internal Medicine

## 2021-08-27 ENCOUNTER — Other Ambulatory Visit (HOSPITAL_BASED_OUTPATIENT_CLINIC_OR_DEPARTMENT_OTHER): Payer: Self-pay

## 2021-08-27 ENCOUNTER — Ambulatory Visit: Payer: Medicare Other | Attending: Internal Medicine

## 2021-08-27 DIAGNOSIS — Z23 Encounter for immunization: Secondary | ICD-10-CM

## 2021-08-27 MED ORDER — PFIZER COVID-19 VAC BIVALENT 30 MCG/0.3ML IM SUSP
INTRAMUSCULAR | 0 refills | Status: DC
  Filled 2021-08-27: qty 0.3, 1d supply, fill #0

## 2021-08-27 NOTE — Progress Notes (Signed)
   Covid-19 Vaccination Clinic  Name:  MERIA CRILLY    MRN: [id number] DOB: [birth year]  08/27/2021  Ms. Stoker was observed post Covid-19 immunization for 15 minutes without incident. She was provided with Vaccine Information Sheet and instruction to access the V-Safe system.   Ms. Potocki was instructed to call [phone] with any severe reactions post vaccine: Difficulty breathing  Swelling of face and throat  A fast heartbeat  A bad rash all over body  Dizziness and weakness

## 2021-09-11 ENCOUNTER — Ambulatory Visit (INDEPENDENT_AMBULATORY_CARE_PROVIDER_SITE_OTHER): Payer: Medicare Other | Admitting: Internal Medicine

## 2021-09-11 ENCOUNTER — Other Ambulatory Visit: Payer: Self-pay

## 2021-09-11 DIAGNOSIS — Z23 Encounter for immunization: Secondary | ICD-10-CM | POA: Diagnosis not present

## 2021-09-11 NOTE — Patient Instructions (Signed)
Flu vaccine given.

## 2021-09-11 NOTE — Progress Notes (Signed)
Flu  vaccine given per CMA. Tolerated well. MJB, MD

## 2021-10-14 ENCOUNTER — Other Ambulatory Visit: Payer: Self-pay

## 2021-10-14 ENCOUNTER — Encounter: Payer: Self-pay | Admitting: Podiatry

## 2021-10-14 ENCOUNTER — Ambulatory Visit (INDEPENDENT_AMBULATORY_CARE_PROVIDER_SITE_OTHER): Payer: Medicare Other | Admitting: Podiatry

## 2021-10-14 DIAGNOSIS — M79674 Pain in right toe(s): Secondary | ICD-10-CM

## 2021-10-14 DIAGNOSIS — B351 Tinea unguium: Secondary | ICD-10-CM

## 2021-10-14 DIAGNOSIS — E119 Type 2 diabetes mellitus without complications: Secondary | ICD-10-CM | POA: Diagnosis not present

## 2021-10-14 DIAGNOSIS — M79675 Pain in left toe(s): Secondary | ICD-10-CM | POA: Diagnosis not present

## 2021-10-14 NOTE — Progress Notes (Signed)
This patient returns to my office for at risk foot care.  This patient requires this care by a professional since this patient will be at risk due to having diabetes and coagulation defect.  Patient is taking plavix.  This patient is unable to cut nails herself since the patient cannot reach hiernails.These nails are painful walking and wearing shoes.  This patient presents for at risk foot care today.  General Appearance  Alert, conversant and in no acute stress.  Vascular  Dorsalis pedis and posterior tibial  pulses are palpable  bilaterally.  Capillary return is within normal limits  bilaterally. Temperature is within normal limits  bilaterally.  Neurologic  Senn-Weinstein monofilament wire test within normal limits  bilaterally. Muscle power within normal limits bilaterally.  Nails Thick disfigured discolored nails with subungual debris  from hallux to fifth toes bilaterally. No evidence of bacterial infection or drainage bilaterally.  Orthopedic  No limitations of motion  feet .  No crepitus or effusions noted.  No bony pathology or digital deformities noted.  Skin  normotropic skin with no porokeratosis noted bilaterally.  No signs of infections or ulcers noted.     Onychomycosis  Pain in right toes  Pain in left toes  Consent was obtained for treatment procedures.   Mechanical debridement of nails 1-5  bilaterally performed with a nail nipper.  Filed with dremel without incident.    Return office visit   3  months                   Told patient to return for periodic foot care and evaluation due to potential at risk complications.   Cletus Paris DPM   

## 2021-11-06 ENCOUNTER — Other Ambulatory Visit: Payer: Self-pay | Admitting: Internal Medicine

## 2021-11-08 ENCOUNTER — Other Ambulatory Visit: Payer: Self-pay | Admitting: Internal Medicine

## 2021-11-19 ENCOUNTER — Other Ambulatory Visit: Payer: Self-pay

## 2021-11-19 ENCOUNTER — Other Ambulatory Visit: Payer: Medicare Other | Admitting: Internal Medicine

## 2021-11-19 DIAGNOSIS — E1169 Type 2 diabetes mellitus with other specified complication: Secondary | ICD-10-CM

## 2021-11-19 LAB — LIPID PANEL
Cholesterol: 131 mg/dL (ref <200)
HDL: 51 mg/dL (ref >=50)
LDL Cholesterol (Calc): 63 mg/dL (calc)
Non-HDL Cholesterol (Calc): 80 mg/dL (calc) (ref <130)
Total CHOL/HDL Ratio: 2.6 (calc) (ref <5.0)
Triglycerides: 85 mg/dL (ref <150)

## 2021-11-19 LAB — HEPATIC FUNCTION PANEL
AG Ratio: 1.7 (calc) (ref 1.0–2.5)
ALT: 11 U/L (ref 6–29)
AST: 16 U/L (ref 10–35)
Albumin: 4 g/dL (ref 3.6–5.1)
Alkaline phosphatase (APISO): 66 U/L (ref 37–153)
Bilirubin, Direct: 0.1 mg/dL (ref 0.0–0.2)
Globulin: 2.4 g/dL (calc) (ref 1.9–3.7)
Indirect Bilirubin: 0.4 mg/dL (calc) (ref 0.2–1.2)
Total Bilirubin: 0.5 mg/dL (ref 0.2–1.2)
Total Protein: 6.4 g/dL (ref 6.1–8.1)

## 2021-11-21 ENCOUNTER — Ambulatory Visit (INDEPENDENT_AMBULATORY_CARE_PROVIDER_SITE_OTHER): Payer: Medicare Other | Admitting: Internal Medicine

## 2021-11-21 ENCOUNTER — Encounter: Payer: Self-pay | Admitting: Internal Medicine

## 2021-11-21 ENCOUNTER — Other Ambulatory Visit: Payer: Self-pay

## 2021-11-21 VITALS — BP 148/80 | HR 79 | Temp 97.3°F | Ht 59.0 in | Wt 126.0 lb

## 2021-11-21 DIAGNOSIS — E1169 Type 2 diabetes mellitus with other specified complication: Secondary | ICD-10-CM

## 2021-11-21 DIAGNOSIS — E782 Mixed hyperlipidemia: Secondary | ICD-10-CM

## 2021-11-21 DIAGNOSIS — G3184 Mild cognitive impairment, so stated: Secondary | ICD-10-CM

## 2021-11-21 DIAGNOSIS — R413 Other amnesia: Secondary | ICD-10-CM

## 2021-11-21 DIAGNOSIS — E785 Hyperlipidemia, unspecified: Secondary | ICD-10-CM

## 2021-11-21 DIAGNOSIS — Z8673 Personal history of transient ischemic attack (TIA), and cerebral infarction without residual deficits: Secondary | ICD-10-CM

## 2021-11-21 DIAGNOSIS — S66811D Strain of other specified muscles, fascia and tendons at wrist and hand level, right hand, subsequent encounter: Secondary | ICD-10-CM

## 2021-11-21 DIAGNOSIS — Z7901 Long term (current) use of anticoagulants: Secondary | ICD-10-CM | POA: Diagnosis not present

## 2021-11-21 DIAGNOSIS — Z0289 Encounter for other administrative examinations: Secondary | ICD-10-CM

## 2021-11-21 DIAGNOSIS — M65351 Trigger finger, right little finger: Secondary | ICD-10-CM

## 2021-11-21 DIAGNOSIS — H409 Unspecified glaucoma: Secondary | ICD-10-CM

## 2021-11-21 NOTE — Patient Instructions (Addendum)
It was a pleasure to see you today. Handicapped parking permit signed. RTC in 6 months. Continue same meds. Lipids are stable and she looks great.

## 2021-11-21 NOTE — Progress Notes (Signed)
° °  Subjective:    Patient ID: Amber Chang, female    DOB: 12/10/1926, 88 y.o.   MRN: [id number]  HPI 88 year old Female for 6 month recheck.  Her daughter, Amber Chang, accompanies her today.  Daughter says patient's memory is a little worse.  Daughter is now retired from the bank and is at home more with her mom.  Patient does not wander.  She does not asked to go anywhere to speak.  Is content at home.  Has been seen by Dr. Fredna Dow for subluxating extensor tendons right ring and right small finger.  Has been wearing splint more recently instead of just at night.  Has right fifth trigger finger.  Surgery is an option but patient and her daughter have decided that she should just continue with the splint.  She is not having pain.  Currently to be seen by him on an as-needed basis.  She will continue with splint.  She is not having any pain.  Immunizations are up-to-date.  Lipid panel and liver functions are both normal.  She is on Aricept for mild memory loss.  History of impaired glucose tolerance and hypertension.  She is on amlodipine 10 mg daily, metformin 500 mg twice daily, losartan 100 mg daily, Aricept 5 mg daily and Plavix 75 mg daily.  Also takes rosuvastatin 20 mg daily and metoprolol 100 mg XL daily.  History of glaucoma treated with Xalatan and Combigan.  Takes Zyrtec for allergic rhinitis symptoms.  Review of Systems no new complaints.  Her daughter says patient's appetite is fairly good     Objective:   Physical Exam Blood pressure 148/80.  She is a little anxious today.  Pulse 79 regular pulse oximetry 94% weight 126 pounds.  Previous weight 130 pounds in July. Skin warm and dry.  Neck supple.  No carotid bruits.  Chest clear.  Cardiac exam regular rate and rhythm without ectopy.  No lower extremity pitting edema.      Assessment & Plan:  Memory loss-continue with Aricept 5 mg at bedtime  Impaired gluose tolerance treated with metformin and hemoglobin A1c in June  was stable at 4.9%.  This was not repeated with this visit.  Chronic anticoagulation treated with Plavix  Hyperlipidemia treated with generic Crestor 20 mg daily.  Lipid panel and liver functions are normal.  Hypertension treated with metoprolol amlodipine losartan  Glaucoma treated with Combigan and Xalatan  Plan: She may continue wearing her right wrist splint but I do not think her hand is going to get better without surgery.  She and her daughter not interested in surgery.  Handicap permit signed and completed.  Follow-up in 6 months.

## 2021-12-16 LAB — HM DIABETES EYE EXAM

## 2022-01-13 ENCOUNTER — Ambulatory Visit: Payer: Medicare Other | Admitting: Podiatry

## 2022-01-18 ENCOUNTER — Other Ambulatory Visit: Payer: Self-pay | Admitting: Internal Medicine

## 2022-01-28 ENCOUNTER — Encounter: Payer: Self-pay | Admitting: Podiatry

## 2022-01-28 ENCOUNTER — Ambulatory Visit (INDEPENDENT_AMBULATORY_CARE_PROVIDER_SITE_OTHER): Payer: Medicare Other | Admitting: Podiatry

## 2022-01-28 ENCOUNTER — Other Ambulatory Visit: Payer: Self-pay

## 2022-01-28 DIAGNOSIS — B351 Tinea unguium: Secondary | ICD-10-CM | POA: Diagnosis not present

## 2022-01-28 DIAGNOSIS — M79674 Pain in right toe(s): Secondary | ICD-10-CM | POA: Diagnosis not present

## 2022-01-28 DIAGNOSIS — M79675 Pain in left toe(s): Secondary | ICD-10-CM | POA: Diagnosis not present

## 2022-01-28 DIAGNOSIS — E119 Type 2 diabetes mellitus without complications: Secondary | ICD-10-CM

## 2022-01-28 NOTE — Progress Notes (Signed)
This patient returns to my office for at risk foot care.  This patient requires this care by a professional since this patient will be at risk due to having diabetes and coagulation defect.  Patient is taking plavix.  This patient is unable to cut nails herself since the patient cannot reach hiernails.These nails are painful walking and wearing shoes.  This patient presents for at risk foot care today.  General Appearance  Alert, conversant and in no acute stress.  Vascular  Dorsalis pedis and posterior tibial  pulses are palpable  bilaterally.  Capillary return is within normal limits  bilaterally. Temperature is within normal limits  bilaterally.  Neurologic  Senn-Weinstein monofilament wire test within normal limits  bilaterally. Muscle power within normal limits bilaterally.  Nails Thick disfigured discolored nails with subungual debris  from hallux to fifth toes bilaterally. No evidence of bacterial infection or drainage bilaterally.  Orthopedic  No limitations of motion  feet .  No crepitus or effusions noted.  No bony pathology or digital deformities noted.  Skin  normotropic skin with no porokeratosis noted bilaterally.  No signs of infections or ulcers noted.     Onychomycosis  Pain in right toes  Pain in left toes  Consent was obtained for treatment procedures.   Mechanical debridement of nails 1-5  bilaterally performed with a nail nipper.  Filed with dremel without incident.    Return office visit   3  months                   Told patient to return for periodic foot care and evaluation due to potential at risk complications.   Sutton Plake DPM   

## 2022-03-23 ENCOUNTER — Encounter (HOSPITAL_COMMUNITY): Payer: Self-pay | Admitting: *Deleted

## 2022-03-23 ENCOUNTER — Emergency Department (HOSPITAL_COMMUNITY)
Admission: EM | Admit: 2022-03-23 | Discharge: 2022-03-24 | Disposition: A | Discharge status: Home / Self Care | Payer: Medicare Other | Attending: Emergency Medicine | Admitting: Emergency Medicine

## 2022-03-23 ENCOUNTER — Other Ambulatory Visit: Payer: Self-pay

## 2022-03-23 DIAGNOSIS — E119 Type 2 diabetes mellitus without complications: Secondary | ICD-10-CM | POA: Diagnosis not present

## 2022-03-23 DIAGNOSIS — Z79899 Other long term (current) drug therapy: Secondary | ICD-10-CM | POA: Insufficient documentation

## 2022-03-23 DIAGNOSIS — F0390 Unspecified dementia without behavioral disturbance: Secondary | ICD-10-CM | POA: Diagnosis not present

## 2022-03-23 DIAGNOSIS — I10 Essential (primary) hypertension: Secondary | ICD-10-CM | POA: Insufficient documentation

## 2022-03-23 DIAGNOSIS — Z7982 Long term (current) use of aspirin: Secondary | ICD-10-CM | POA: Diagnosis not present

## 2022-03-23 DIAGNOSIS — D649 Anemia, unspecified: Secondary | ICD-10-CM | POA: Insufficient documentation

## 2022-03-23 DIAGNOSIS — R55 Syncope and collapse: Secondary | ICD-10-CM | POA: Diagnosis present

## 2022-03-23 DIAGNOSIS — Z7984 Long term (current) use of oral hypoglycemic drugs: Secondary | ICD-10-CM | POA: Insufficient documentation

## 2022-03-23 MED ORDER — SODIUM CHLORIDE 0.9 % IV BOLUS
1000.0000 mL | Freq: Once | INTRAVENOUS | Status: AC
  Administered 2022-03-24: 1000 mL via INTRAVENOUS

## 2022-03-23 MED ORDER — SODIUM CHLORIDE 0.9 % IV SOLN: INTRAVENOUS | Status: DC

## 2022-03-23 NOTE — ED Triage Notes (Signed)
Pt from home with EMS for syncopal episode after having an episode of diarrhea. 20g IV to L arm  ? ?VS 166//82; 72 pulse; CBG 140 95% on room air  ?

## 2022-03-24 ENCOUNTER — Encounter (HOSPITAL_COMMUNITY): Payer: Self-pay | Admitting: Emergency Medicine

## 2022-03-24 DIAGNOSIS — R55 Syncope and collapse: Secondary | ICD-10-CM | POA: Diagnosis not present

## 2022-03-24 LAB — COMPREHENSIVE METABOLIC PANEL
ALT: 15 U/L (ref 0–44)
AST: 17 U/L (ref 15–41)
Albumin: 3.4 g/dL — ABNORMAL LOW (ref 3.5–5.0)
Alkaline Phosphatase: 58 U/L (ref 38–126)
Anion gap: 8 (ref 5–15)
BUN: 13 mg/dL (ref 8–23)
CO2: 24 mmol/L (ref 22–32)
Calcium: 9.5 mg/dL (ref 8.9–10.3)
Chloride: 104 mmol/L (ref 98–111)
Creatinine, Ser: 0.92 mg/dL (ref 0.44–1.00)
GFR, Estimated: 58 mL/min — ABNORMAL LOW (ref >60)
Glucose, Bld: 115 mg/dL — ABNORMAL HIGH (ref 70–99)
Potassium: 3.4 mmol/L — ABNORMAL LOW (ref 3.5–5.1)
Sodium: 136 mmol/L (ref 135–145)
Total Bilirubin: 0.6 mg/dL (ref 0.3–1.2)
Total Protein: 6.3 g/dL — ABNORMAL LOW (ref 6.5–8.1)

## 2022-03-24 LAB — CBC WITH DIFFERENTIAL/PLATELET
Abs Immature Granulocytes: 0.02 10*3/uL (ref 0.00–0.07)
Basophils Absolute: 0 10*3/uL (ref 0.0–0.1)
Basophils Relative: 1 %
Eosinophils Absolute: 0.3 10*3/uL (ref 0.0–0.5)
Eosinophils Relative: 4 %
HCT: 35.3 % — ABNORMAL LOW (ref 36.0–46.0)
Hemoglobin: 10.8 g/dL — ABNORMAL LOW (ref 12.0–15.0)
Immature Granulocytes: 0 %
Lymphocytes Relative: 26 %
Lymphs Abs: 1.8 10*3/uL (ref 0.7–4.0)
MCH: 29.3 pg (ref 26.0–34.0)
MCHC: 30.6 g/dL (ref 30.0–36.0)
MCV: 95.7 fL (ref 80.0–100.0)
Monocytes Absolute: 0.7 10*3/uL (ref 0.1–1.0)
Monocytes Relative: 11 %
Neutro Abs: 3.9 10*3/uL (ref 1.7–7.7)
Neutrophils Relative %: 58 %
Platelets: 296 10*3/uL (ref 150–400)
RBC: 3.69 MIL/uL — ABNORMAL LOW (ref 3.87–5.11)
RDW: 12 % (ref 11.5–15.5)
WBC: 6.8 10*3/uL (ref 4.0–10.5)
nRBC: 0 % (ref 0.0–0.2)

## 2022-03-24 LAB — TROPONIN I (HIGH SENSITIVITY)
Troponin I (High Sensitivity): <2 ng/L (ref <18)
Troponin I (High Sensitivity): 3 ng/L (ref <18)

## 2022-03-24 LAB — I-STAT CHEM 8, ED
BUN: 13 mg/dL (ref 8–23)
Calcium, Ion: 1.14 mmol/L — ABNORMAL LOW (ref 1.15–1.40)
Chloride: 101 mmol/L (ref 98–111)
Creatinine, Ser: 0.8 mg/dL (ref 0.44–1.00)
Glucose, Bld: 114 mg/dL — ABNORMAL HIGH (ref 70–99)
HCT: 34 % — ABNORMAL LOW (ref 36.0–46.0)
Hemoglobin: 11.6 g/dL — ABNORMAL LOW (ref 12.0–15.0)
Potassium: 3.4 mmol/L — ABNORMAL LOW (ref 3.5–5.1)
Sodium: 136 mmol/L (ref 135–145)
TCO2: 26 mmol/L (ref 22–32)

## 2022-03-24 LAB — CBG MONITORING, ED: Glucose-Capillary: 125 mg/dL — ABNORMAL HIGH (ref 70–99)

## 2022-03-24 NOTE — ED Provider Notes (Signed)
?[facility] ?Provider Note ? ?CSN: [id number] ?Arrival date & time: 03/23/22 2338 ? ?Chief Complaint(s) ?Near Syncope ? ?HPI ?Amber Chang is a 88 y.o. female with a past medical history listed below including hypertension, diabetes, dementia accompanied by her daughter who is providing history.  She is here for syncopal episode at home.  This occurred while on the toilet.  Daughter reports that the patient needed to go to the restroom.  While having a bowel movement the patient became extremely nauseated and had a bout of nonbloody nonbilious emesis.  She immediately lost consciousness and slumped forward.  She was braced by the daughter.  Patient did not fall or hit her head.  Patient came to within 1 to 2 minutes.  Daughter reports that the bowel movements were not bloody.  Patient has not been sick or has had any physical complaints.  Currently the patient denies any physical complaints.  States that she did not have any chest pain during the episode ? ? ?Near Syncope ? ? ?Past Medical History ?History reviewed. No pertinent past medical history. ?Patient Active Problem List  ? Diagnosis Date Noted  ? Pain due to onychomycosis of toenails of both feet 06/14/2021  ? Essential hypertension 12/09/2015  ? Diabetes mellitus without complication ([place]) 25/85/2778  ? Glaucoma 12/09/2015  ? Hyperlipidemia 12/09/2015  ? ?Home Medication(s) ?Prior to Admission medications   ?Medication Sig Start Date End Date Taking? Authorizing Provider  ?albuterol (VENTOLIN HFA) 108 (90 Base) MCG/ACT inhaler INHALE 2 PUFFS INTO THE LUNGS EVERY 6 HOURS AS NEEDED FOR WHEEZING OR SHORTNESS OF BREATH 02/28/21   Elby Showers, MD  ?amLODipine (NORVASC) 10 MG tablet TAKE 1 TABLET(10 MG) BY MOUTH DAILY 01/18/22   Elby Showers, MD  ?aspirin EC 81 MG tablet Take 81 mg by mouth daily. Swallow whole.    [provider]  ?benzonatate (TESSALON) 100 MG capsule Take 1 capsule (100 mg total) by mouth 2  (two) times daily as needed for cough. 05/16/21   Elby Showers, MD  ?Blood Glucose Monitoring Suppl (PRODIGY AUTOCODE BLOOD GLUCOSE) w/Device KIT Check glucose three times a day  ?Dispense lancets and test strips 05/08/20   Elby Showers, MD  ?cetirizine (ZYRTEC) 10 MG tablet Take 10 mg by mouth daily.    [provider]  ?clopidogrel (PLAVIX) 75 MG tablet TAKE 1 TABLET(75 MG) BY MOUTH DAILY 04/23/21   Elby Showers, MD  ?COMBIGAN 0.2-0.5 % ophthalmic solution instill 1 drop into affected eye every 12 hours 09/17/15   [provider]  ?COVID-19 mRNA bivalent vaccine, Pfizer, (PFIZER COVID-19 VAC BIVALENT) injection Inject into the muscle. 08/27/21   Carlyle Basques, MD  ?COVID-19 mRNA Vac-TriS, Pfizer, (PFIZER-BIONT COVID-19 VAC-TRIS) SUSP injection Inject into the muscle. 02/27/21   Carlyle Basques, MD  ?donepezil (ARICEPT) 5 MG tablet TAKE 1 TABLET(5 MG) BY MOUTH AT BEDTIME 11/08/21   Elby Showers, MD  ?latanoprost (XALATAN) 0.005 % ophthalmic solution  09/17/15   [provider]  ?losartan (COZAAR) 100 MG tablet TAKE 1 TABLET BY MOUTH EVERY DAY 11/06/21   Elby Showers, MD  ?metFORMIN (GLUCOPHAGE) 500 MG tablet TAKE 1 TABLET(500 MG) BY MOUTH TWICE DAILY 07/22/21   Elby Showers, MD  ?metoprolol succinate (TOPROL-XL) 100 MG 24 hr tablet TAKE 1 TABLET BY MOUTH EVERY DAY WITH OR IMMEDIATELY FOLLOWING A MEAL 01/18/22   Elby Showers, MD  ?rosuvastatin (CRESTOR) 20 MG tablet TAKE 1 TABLET(20 MG) BY  MOUTH DAILY 04/23/21   Elby Showers, MD  ?                                                                                                                                  ?Allergies ?Penicillins ? ?Review of Systems ?Review of Systems  ?Cardiovascular:  Positive for near-syncope.  ?As noted in HPI ? ?Physical Exam ?Vital Signs  ?I have reviewed the triage vital signs ?BP 134/65   Pulse 85   Temp 98.7 ?F (37.1 ?C)   Resp 15   Ht _0  (1.499 m)   Wt 56.7 kg   SpO2 95%   BMI 25.25 kg/m?   ? ?Physical Exam ?Vitals reviewed.  ?Constitutional:   ?   General: She is not in acute distress. ?   Appearance: She is well-developed. She is not diaphoretic.  ?HENT:  ?   Head: Normocephalic and atraumatic.  ?   Nose: Nose normal.  ?Eyes:  ?   General: No scleral icterus.    ?   Right eye: No discharge.     ?   Left eye: No discharge.  ?   Conjunctiva/sclera: Conjunctivae normal.  ?   Pupils: Pupils are equal, round, and reactive to light.  ?Cardiovascular:  ?   Rate and Rhythm: Normal rate and regular rhythm.  ?   Heart sounds: No murmur heard. ?  No friction rub. No gallop.  ?Pulmonary:  ?   Effort: Pulmonary effort is normal. No respiratory distress.  ?   Breath sounds: Normal breath sounds. No stridor. No rales.  ?Abdominal:  ?   General: There is no distension.  ?   Palpations: Abdomen is soft.  ?   Tenderness: There is no abdominal tenderness.  ?Musculoskeletal:     ?   General: No tenderness.  ?   Cervical back: Normal range of motion and neck supple.  ?Skin: ?   General: Skin is warm and dry.  ?   Findings: No erythema or rash.  ?Neurological:  ?   Mental Status: She is alert.  ?   Comments: Mental Status:  ?Alert and oriented to person, place,  ?Attention and concentration normal.  ?Speech clear.  ? ? ?Cranial Nerves:  ?II Visual Fields: Intact to confrontation. Visual fields intact. ?III, IV, VI: Pupils equal and reactive to light and near. Full eye movement without nystagmus  ?V Facial Sensation: Normal. No weakness of masticatory muscles  ?VII: No facial weakness or asymmetry  ?VIII Auditory Acuity: Grossly normal  ?IX/X: The uvula is midline; the palate elevates symmetrically  ?XI: Normal sternocleidomastoid and trapezius strength  ?XII: The tongue is midline. No atrophy or fasciculations.  ? ?Motor System: Muscle Strength: 5/5 and symmetric in the upper and lower extremities. No pronation or drift.  ?Muscle Tone: Tone and muscle bulk are normal in the upper and lower extremities.  ?Coordination: No  tremor.  ?Sensation: Intact to light  touch ?Gait: deferred ?  ? ? ?ED Results and Treatments ?Labs ?(all labs ordered are listed, but only abnormal results are displayed) ?Labs Reviewed  ?CBC WITH DIFFERENTIAL/PLATELET - Abnormal; Notable for the following components:  ?    Result Value  ? RBC 3.69 (*)   ? Hemoglobin 10.8 (*)   ? HCT 35.3 (*)   ? All other components within normal limits  ?COMPREHENSIVE METABOLIC PANEL - Abnormal; Notable for the following components:  ? Potassium 3.4 (*)   ? Glucose, Bld 115 (*)   ? Total Protein 6.3 (*)   ? Albumin 3.4 (*)   ? GFR, Estimated 58 (*)   ? All other components within normal limits  ?CBG MONITORING, ED - Abnormal; Notable for the following components:  ? Glucose-Capillary 125 (*)   ? All other components within normal limits  ?I-STAT CHEM 8, ED - Abnormal; Notable for the following components:  ? Potassium 3.4 (*)   ? Glucose, Bld 114 (*)   ? Calcium, Ion 1.14 (*)   ? Hemoglobin 11.6 (*)   ? HCT 34.0 (*)   ? All other components within normal limits  ?TROPONIN I (HIGH SENSITIVITY)  ?TROPONIN I (HIGH SENSITIVITY)  ?                                                                                                                       ?EKG ? EKG Interpretation ? ?Date/Time:  Monday Mar 24 2022 00:16:07 EDT ?Ventricular Rate:  76 ?PR Interval:  158 ?QRS Duration: 138 ?QT Interval:  428 ?QTC Calculation: 481 ?R Axis:   92 ?Text Interpretation: Normal sinus rhythm with sinus arrhythmia Rightward axis Non-specific intra-ventricular conduction block Abnormal ECG No previous ECGs available Confirmed by Addison Lank [phone]) on 03/24/2022 1:15:38 AM ?  ? ?  ? ?Radiology ?No results found. ? ?Pertinent labs & imaging results that were available during my care of the patient were reviewed by me and considered in my medical decision making (see MDM for details). ? ?Medications Ordered in ED ?Medications  ?sodium chloride 0.9 % bolus 1,000 mL (0 mLs Intravenous Stopped 03/24/22 0101)   ?  And  ?0.9 %  sodium chloride infusion ( Intravenous New Bag/Given 03/24/22 0100)  ?                                                               ?

## 2022-03-24 NOTE — ED Notes (Signed)
NT placed pt on purewick due to pt complaining of dizziness when attempting to stand to go to BR.  ?

## 2022-03-24 NOTE — ED Notes (Signed)
PT ambulated in hallway with a walker. When this NT asked PT about any dizziness or weakness, PT denied both. No difficulties in ambulation noted by this NT. ?

## 2022-03-27 ENCOUNTER — Ambulatory Visit (INDEPENDENT_AMBULATORY_CARE_PROVIDER_SITE_OTHER): Payer: Medicare Other | Admitting: Internal Medicine

## 2022-03-27 ENCOUNTER — Encounter: Payer: Self-pay | Admitting: Internal Medicine

## 2022-03-27 ENCOUNTER — Ambulatory Visit
Admission: RE | Admit: 2022-03-27 | Discharge: 2022-03-27 | Disposition: A | Discharge status: Home / Self Care | Payer: Medicare Other | Source: Ambulatory Visit | Attending: Internal Medicine | Admitting: Internal Medicine

## 2022-03-27 VITALS — BP 130/70 | HR 70 | Resp 20 | Wt 120.8 lb

## 2022-03-27 DIAGNOSIS — R55 Syncope and collapse: Secondary | ICD-10-CM

## 2022-03-27 DIAGNOSIS — I509 Heart failure, unspecified: Secondary | ICD-10-CM

## 2022-03-27 DIAGNOSIS — I10 Essential (primary) hypertension: Secondary | ICD-10-CM

## 2022-03-27 LAB — CBC WITH DIFFERENTIAL/PLATELET
Absolute Monocytes: 704 cells/uL (ref 200–950)
Basophils Absolute: 41 cells/uL (ref 0–200)
Basophils Relative: 0.6 %
Eosinophils Absolute: 262 cells/uL (ref 15–500)
Eosinophils Relative: 3.8 %
HCT: 36.4 % (ref 35.0–45.0)
Hemoglobin: 11.6 g/dL — ABNORMAL LOW (ref 11.7–15.5)
Lymphs Abs: 1822 cells/uL (ref 850–3900)
MCH: 29.9 pg (ref 27.0–33.0)
MCHC: 31.9 g/dL — ABNORMAL LOW (ref 32.0–36.0)
MCV: 93.8 fL (ref 80.0–100.0)
MPV: 8.8 fL (ref 7.5–12.5)
Monocytes Relative: 10.2 %
Neutro Abs: 4071 cells/uL (ref 1500–7800)
Neutrophils Relative %: 59 %
Platelets: 325 10*3/uL (ref 140–400)
RBC: 3.88 10*6/uL (ref 3.80–5.10)
RDW: 11.6 % (ref 11.0–15.0)
Total Lymphocyte: 26.4 %
WBC: 6.9 10*3/uL (ref 3.8–10.8)

## 2022-03-27 NOTE — Progress Notes (Signed)
Subjective:    Patient ID: Amber Chang, female    DOB: 04-Nov-1927, 88 y.o.   MRN: [id number]  HPI 88 year old Female seen foe ED follow up.  She was in the emergency department Mar 23, 2022 with noncardiac syncope.  She was not admitted to the hospital.  Patient resides at home with her daughter.  Daughter reported that patient needed to go to the restroom and while having a bowel movement she became nauseated and had nonbloody emesis.  Had brief loss of consciousness and slumped forward.  She did not fall or hit her head and came to within 1 to 2 minutes.  Did have bowel movement but no blood in bowel movement.  Patient had been feeling well prior to this episode.  Patient was last seen here in January 2023 for 82-month recheck.  She has a history of impaired glucose tolerance and hypertension.  Glucose intolerance is treated with metformin.  For hypertension she is on amlodipine and losartan.  She has mild dementia and takes Aricept.  She has hyperlipidemia treated with rosuvastatin.  History of glaucoma treated with Xalatan and Combigan.  Takes Zyrtec for allergic rhinitis symptoms.  She has a history of stroke in the remote past but it is not clear from old records when that occurred.  She has been maintained on Plavix and aspirin.  She moved here in January 2017 from [place], [place] to be with her daughter here in [place].  In the emergency department she was treated conservatively.  She was found to have a mild anemia of 10.8 g with an MCV of 95.7 after hydration in the emergency department and blood drawing.  Upon arrival her hemoglobin was 11.6 g.  It is repeated stat today and is 11.6 g which is reassuring.  Potassium was slightly low at 3.4.  Glucose was 115.  Total protein was 6.3 and albumin 3.4-both values slightly low.  Troponin 1 was negative.  Glucose was 114.  Ionic calcium slightly low at 1.14.  It was felt that  patient had vasovagal syncope.  I concur with this diagnosis based on the fact that she was on the toilet at the time of her syncope and only had brief loss of consciousness.  However, we thought it would be prudent to recheck her CBC today as well as her TSH and obtain a BNP.  C-Met was also checked.  C-Met is normal.  BNP is normal.  TSH is normal.  Hemoglobin 11.6 g and stable from previous level on May 15.    Review of Systems no new complaints.  She is alert.  She recognizes me.  She has no extremity weakness or numbness.  No facial weakness.  No dysarthria.     Objective:   Physical Exam Vital signs reviewed.  Blood pressure 130/70 BMI 24.40 weight 120 pounds 12.8 ounces pulse oximetry 98% on room air.  Respiratory rate increased to 20 but she is a little anxious today.  Pulse 70 and regular.  Chest is clear.  No carotid bruits.  Cardiac exam: Regular rate and rhythm without murmurs or ectopy.  No lower extremity pitting edema.  Brief neurological exam is intact without gross focal deficits.       Assessment & Plan:   My feeling is that she had vasovagal syncope on the toilet which is common in elderly individuals that are a bit frail.  Her labs are stable and we can  discharge her home to be observed by her daughter.  A chest x-ray was obtained today showing no pleural effusions and no pneumonia.  No evidence of CHF.  BNP is normal today.  TSH and CBC are within normal limits.  C-Met is normal.  Daughter would like echocardiogram performed.  This has been ordered.  Advise close observation and she will continue to be cared for by her daughter at home.  Advise staying well-hydrated.

## 2022-03-28 LAB — BRAIN NATRIURETIC PEPTIDE: Brain Natriuretic Peptide: 24 pg/mL (ref <100)

## 2022-03-28 LAB — COMPREHENSIVE METABOLIC PANEL
AG Ratio: 1.5 (calc) (ref 1.0–2.5)
ALT: 14 U/L (ref 6–29)
AST: 18 U/L (ref 10–35)
Albumin: 4 g/dL (ref 3.6–5.1)
Alkaline phosphatase (APISO): 63 U/L (ref 37–153)
BUN: 12 mg/dL (ref 7–25)
CO2: 29 mmol/L (ref 20–32)
Calcium: 9.5 mg/dL (ref 8.6–10.4)
Chloride: 101 mmol/L (ref 98–110)
Creat: 0.81 mg/dL (ref 0.60–0.95)
Globulin: 2.7 g/dL (calc) (ref 1.9–3.7)
Glucose, Bld: 70 mg/dL (ref 65–99)
Potassium: 4 mmol/L (ref 3.5–5.3)
Sodium: 138 mmol/L (ref 135–146)
Total Bilirubin: 0.5 mg/dL (ref 0.2–1.2)
Total Protein: 6.7 g/dL (ref 6.1–8.1)

## 2022-03-28 LAB — TSH: TSH: 1.12 mIU/L (ref 0.40–4.50)

## 2022-04-07 NOTE — Patient Instructions (Addendum)
Echocardiogram ordered per daughter's request.  Labs are stable.  No evidence of congestive heart failure.  I think she had vasovagal syncope on the toilet which is common in elderly individuals.  Chest x-ray is negative.  Labs are stable.  Has wellness exam in July.

## 2022-04-18 ENCOUNTER — Other Ambulatory Visit: Payer: Self-pay | Admitting: Internal Medicine

## 2022-04-30 ENCOUNTER — Ambulatory Visit (INDEPENDENT_AMBULATORY_CARE_PROVIDER_SITE_OTHER): Payer: Medicare Other | Admitting: Podiatry

## 2022-04-30 ENCOUNTER — Encounter: Payer: Self-pay | Admitting: Podiatry

## 2022-04-30 DIAGNOSIS — E119 Type 2 diabetes mellitus without complications: Secondary | ICD-10-CM

## 2022-04-30 DIAGNOSIS — M79675 Pain in left toe(s): Secondary | ICD-10-CM | POA: Diagnosis not present

## 2022-04-30 DIAGNOSIS — B351 Tinea unguium: Secondary | ICD-10-CM

## 2022-04-30 DIAGNOSIS — M79674 Pain in right toe(s): Secondary | ICD-10-CM

## 2022-04-30 NOTE — Progress Notes (Signed)
This patient returns to my office for at risk foot care.  This patient requires this care by a professional since this patient will be at risk due to having diabetes and coagulation defect.  Patient is taking plavix.  This patient is unable to cut nails herself since the patient cannot reach hiernails.These nails are painful walking and wearing shoes.  This patient presents for at risk foot care today.  General Appearance  Alert, conversant and in no acute stress.  Vascular  Dorsalis pedis and posterior tibial  pulses are palpable  bilaterally.  Capillary return is within normal limits  bilaterally. Temperature is within normal limits  bilaterally.  Neurologic  Senn-Weinstein monofilament wire test within normal limits  bilaterally. Muscle power within normal limits bilaterally.  Nails Thick disfigured discolored nails with subungual debris  from hallux to fifth toes bilaterally. No evidence of bacterial infection or drainage bilaterally.  Orthopedic  No limitations of motion  feet .  No crepitus or effusions noted.  No bony pathology or digital deformities noted.  Skin  normotropic skin with no porokeratosis noted bilaterally.  No signs of infections or ulcers noted.     Onychomycosis  Pain in right toes  Pain in left toes  Consent was obtained for treatment procedures.   Mechanical debridement of nails 1-5  bilaterally performed with a nail nipper.  Filed with dremel without incident.    Return office visit   3  months                   Told patient to return for periodic foot care and evaluation due to potential at risk complications.   Kylynn Street DPM   

## 2022-05-16 ENCOUNTER — Other Ambulatory Visit: Payer: Medicare Other

## 2022-05-16 DIAGNOSIS — E119 Type 2 diabetes mellitus without complications: Secondary | ICD-10-CM

## 2022-05-16 DIAGNOSIS — E782 Mixed hyperlipidemia: Secondary | ICD-10-CM

## 2022-05-17 LAB — MICROALBUMIN, URINE: Microalb, Ur: 14.6 mg/dL

## 2022-05-17 LAB — HEMOGLOBIN A1C
Hgb A1c MFr Bld: 4.9 % of total Hgb (ref <5.7)
Mean Plasma Glucose: 94 mg/dL
eAG (mmol/L): 5.2 mmol/L

## 2022-05-17 LAB — LIPID PANEL
Cholesterol: 120 mg/dL (ref <200)
HDL: 52 mg/dL (ref >=50)
LDL Cholesterol (Calc): 54 mg/dL (calc)
Non-HDL Cholesterol (Calc): 68 mg/dL (calc) (ref <130)
Total CHOL/HDL Ratio: 2.3 (calc) (ref <5.0)
Triglycerides: 64 mg/dL (ref <150)

## 2022-05-19 ENCOUNTER — Ambulatory Visit (INDEPENDENT_AMBULATORY_CARE_PROVIDER_SITE_OTHER): Payer: Medicare Other | Admitting: Internal Medicine

## 2022-05-19 ENCOUNTER — Encounter: Payer: Self-pay | Admitting: Internal Medicine

## 2022-05-19 ENCOUNTER — Telehealth: Payer: Self-pay

## 2022-05-19 VITALS — BP 140/78 | HR 80 | Temp 97.3°F | Ht 59.0 in | Wt 119.0 lb

## 2022-05-19 DIAGNOSIS — M65351 Trigger finger, right little finger: Secondary | ICD-10-CM

## 2022-05-19 DIAGNOSIS — Z8673 Personal history of transient ischemic attack (TIA), and cerebral infarction without residual deficits: Secondary | ICD-10-CM | POA: Diagnosis not present

## 2022-05-19 DIAGNOSIS — G3184 Mild cognitive impairment, so stated: Secondary | ICD-10-CM

## 2022-05-19 DIAGNOSIS — H409 Unspecified glaucoma: Secondary | ICD-10-CM

## 2022-05-19 DIAGNOSIS — Z1211 Encounter for screening for malignant neoplasm of colon: Secondary | ICD-10-CM

## 2022-05-19 DIAGNOSIS — I10 Essential (primary) hypertension: Secondary | ICD-10-CM

## 2022-05-19 DIAGNOSIS — Z0000 Encounter for general adult medical examination without abnormal findings: Secondary | ICD-10-CM

## 2022-05-19 DIAGNOSIS — S66811D Strain of other specified muscles, fascia and tendons at wrist and hand level, right hand, subsequent encounter: Secondary | ICD-10-CM

## 2022-05-19 DIAGNOSIS — E785 Hyperlipidemia, unspecified: Secondary | ICD-10-CM

## 2022-05-19 DIAGNOSIS — E782 Mixed hyperlipidemia: Secondary | ICD-10-CM

## 2022-05-19 DIAGNOSIS — I509 Heart failure, unspecified: Secondary | ICD-10-CM

## 2022-05-19 DIAGNOSIS — R413 Other amnesia: Secondary | ICD-10-CM

## 2022-05-19 DIAGNOSIS — R609 Edema, unspecified: Secondary | ICD-10-CM

## 2022-05-19 DIAGNOSIS — E1169 Type 2 diabetes mellitus with other specified complication: Secondary | ICD-10-CM

## 2022-05-19 DIAGNOSIS — Z7901 Long term (current) use of anticoagulants: Secondary | ICD-10-CM

## 2022-05-19 NOTE — Progress Notes (Signed)
Annual Wellness Visit     Patient: Amber Chang, Female    DOB: [birth year], 88 y.o.   MRN: [id number] Visit Date: 05/19/2022  Chief Complaint  Patient presents with   Medicare [place] is a 88 y.o. female who presents today for her Annual Wellness Visit.  HPI She also presents for annual health maintenance exam and evaluation of medical issues.  She is accompanied by her daughter.  She had an episode of syncope in May and was seen in the emergency department.  This was felt to be noncardiac syncope.  She was seen here in follow-up 4 days later.  It seemed to be related to having to go to the restroom and having a bowel movement.  She became nauseated and had nonbloody emesis.  Had brief loss of consciousness and slumped forward.  She came to within 1 to 2 minutes.  In my opinion she had vasovagal syncope and the ER physician came to the same conclusion.  Labs checked in May here were stable.  She has a history of impaired glucose tolerance and essential hypertension.  Hypertension is treated with amlodipine and losartan.  Glucose intolerance treated with metformin.  She has mild dementia and takes Aricept.  Aricept was started in 2020 for mild memory loss.  Hyperlipidemia is treated with rosuvastatin.  History of glaucoma treated with Xalatan and Combigan.  She takes Zyrtec for allergic rhinitis symptoms.  She has history of stroke in the remote past but it is not clear from her old records when that occurred.  She has been maintained on Plavix and aspirin.  She moved here in January 2017 from [place], [place] in [place] to be with her daughter and live here in [place].  She has been seen by Dr. Fredna Dow for rupture of extensor tendons of right hand and wrist and has trigger finger of right fifth finger.  She has onychomycosis of her toenails.  We have recommended Podiatrist see her.  Social history: She  retired from [employer], [place].  Her husband was a Administrator but he passed away.  She worked as a Programmer, applications.  She does not smoke or consume alcohol.  Now lives with her daughter here in [place].  Her daughter is a widow.  Family history: Father died at age 52 with history of stroke and pneumonia.  Mother died at age 3 of a stroke.  10 brothers are all deceased.  2 brothers died of strokes.  3 brothers died of cancer.  1 brother died of brain cancer.  2 brothers died of GI cancers.  All brothers had hypertension.  1 daughter died at age 72 in an automobile accident.  Daughter, Amber Chang with whom she resides, has history of cardiomyopathy and hypertension.          Patient Care Team: Elby Showers, MD as PCP - General (Internal Medicine)  Review of Systems no new complaints.  She is very cooperative and gentle.      Vitals: BP 140/78   Pulse 80   Temp (!) 97.3 F (36.3 C) (Tympanic)   Ht '4\' 11"'$  (1.499 m)   Wt 119 lb (54 kg)   SpO2 98%   BMI 24.04 kg/m   Physical Exam  Skin: Warm and dry.  No cervical adenopathy.  No carotid bruits noted.  Chest is clear.  Cardiac exam: Regular rate and rhythm  without ectopy.  Abdomen is soft nondistended without hepatosplenomegaly or masses.  No lower extremity pitting edema.  She has onychomycosis of toenails.  Brief neurological exam is intact.   Most recent functional status assessment:    05/19/2022   11:03 AM  In your present state of health, do you have any difficulty performing the following activities:  Hearing? 0  Vision? 0  Difficulty concentrating or making decisions? 1  Walking or climbing stairs? 1  Dressing or bathing? 1  Doing errands, shopping? 1  Preparing Food and eating ? Y  Using the Toilet? N  In the past six months, have you accidently leaked urine? Y  Do you have problems with loss of bowel control? N  Managing your Medications? N  Managing your Finances? Y  Housekeeping or  managing your Housekeeping? Y   Most recent fall risk assessment:    05/19/2022   11:03 AM  [place] in the past year? 0  Number falls in past yr: 0  Injury with Fall? 0  Risk for fall due to : Impaired balance/gait;Impaired mobility  Follow up Falls evaluation completed    Most recent depression screenings:    05/19/2022   11:03 AM 05/16/2021    2:30 PM  PHQ 2/9 Scores  PHQ - 2 Score 0 0   Most recent cognitive screening:    05/19/2022   11:04 AM  6CIT Screen  What Year? 4 points  What month? 3 points  What time? 3 points  Count back from 20 4 points  Months in reverse 4 points       Assessment & Plan  Mild memory loss treated with Aricept  History of vasovagal syncope requiring emergency department evaluation recently  Essential hypertension stable on amlodipine and metoprolol  Hyperlipidemia treated with Crestor 20 mg daily  Impaired glucose tolerance treated with metformin 500 mg twice daily  History of glaucoma treated with Xalatan and Combigan  Chronic anticoagulation with Plavix and aspirin due to remote history of stroke.  Also takes aspirin 81 mg daily.  History of allergic rhinitis treated with Zyrtec  Onychomycosis seen by podiatrist for toenail trimming  Plan: Follow-up with patient in 6 months.  Daughter will call me sooner if there are any concerns or questions.  No change in medications.          Annual wellness visit done today including the all of the following: Reviewed patient's Family Medical History Reviewed and updated list of patient's medical providers Assessment of cognitive impairment was done Assessed patient's functional ability Established a written schedule for health screening [place] Completed and Reviewed  Discussed health benefits of physical activity, and encouraged her to engage in regular exercise appropriate for her age and condition.         {I, Elby Showers, MD, have  reviewed all documentation for this visit. The documentation on 07/06/22 for the exam, diagnosis, procedures, and orders are all accurate and complete.   Angus Seller, CMA

## 2022-05-19 NOTE — Telephone Encounter (Signed)
Detailed message left on identifiable machine belonging to the patients daughter with advice for her to use Endit on the ulcer that her mother is forming instead of the vaseline.. Advised to call with any questions or concerns.

## 2022-05-19 NOTE — Patient Instructions (Signed)
Pharmacy has recommended emollient cream to use on buttock as Fanny Cream is no longer available as a compounded item. It was a pleasure to see you today. Labs are stable. RTC in 6 months.

## 2022-05-22 LAB — HEMOCCULT GUIAC POC 1CARD (OFFICE): Fecal Occult Blood, POC: NEGATIVE

## 2022-07-17 ENCOUNTER — Other Ambulatory Visit: Payer: Self-pay | Admitting: Internal Medicine

## 2022-07-17 DIAGNOSIS — E119 Type 2 diabetes mellitus without complications: Secondary | ICD-10-CM

## 2022-07-30 ENCOUNTER — Ambulatory Visit (INDEPENDENT_AMBULATORY_CARE_PROVIDER_SITE_OTHER): Payer: Medicare Other | Admitting: Podiatry

## 2022-07-30 ENCOUNTER — Encounter: Payer: Self-pay | Admitting: Podiatry

## 2022-07-30 DIAGNOSIS — M79675 Pain in left toe(s): Secondary | ICD-10-CM

## 2022-07-30 DIAGNOSIS — E119 Type 2 diabetes mellitus without complications: Secondary | ICD-10-CM

## 2022-07-30 DIAGNOSIS — B351 Tinea unguium: Secondary | ICD-10-CM | POA: Diagnosis not present

## 2022-07-30 DIAGNOSIS — M79674 Pain in right toe(s): Secondary | ICD-10-CM

## 2022-07-30 NOTE — Progress Notes (Signed)
This patient returns to my office for at risk foot care.  This patient requires this care by a professional since this patient will be at risk due to having diabetes and coagulation defect.  Patient is taking plavix.  This patient is unable to cut nails herself since the patient cannot reach hiernails.These nails are painful walking and wearing shoes.  This patient presents for at risk foot care today.  General Appearance  Alert, conversant and in no acute stress.  Vascular  Dorsalis pedis and posterior tibial  pulses are palpable  bilaterally.  Capillary return is within normal limits  bilaterally. Temperature is within normal limits  bilaterally.  Neurologic  Senn-Weinstein monofilament wire test within normal limits  bilaterally. Muscle power within normal limits bilaterally.  Nails Thick disfigured discolored nails with subungual debris  from hallux to fifth toes bilaterally. No evidence of bacterial infection or drainage bilaterally.  Orthopedic  No limitations of motion  feet .  No crepitus or effusions noted.  No bony pathology or digital deformities noted.  Skin  normotropic skin with no porokeratosis noted bilaterally.  No signs of infections or ulcers noted.     Onychomycosis  Pain in right toes  Pain in left toes  Consent was obtained for treatment procedures.   Mechanical debridement of nails 1-5  bilaterally performed with a nail nipper.  Filed with dremel without incident.    Return office visit   3  months                   Told patient to return for periodic foot care and evaluation due to potential at risk complications.   Arionne Iams DPM   

## 2022-08-02 ENCOUNTER — Other Ambulatory Visit: Payer: Self-pay | Admitting: Internal Medicine

## 2022-08-18 ENCOUNTER — Telehealth: Payer: Self-pay | Admitting: Internal Medicine

## 2022-08-18 MED ORDER — LOSARTAN POTASSIUM 100 MG PO TABS: 100.0000 mg | ORAL_TABLET | Freq: Every day | ORAL | 2 refills | Status: DC

## 2022-08-18 MED ORDER — LOSARTAN POTASSIUM 100 MG PO TABS: 100.0000 mg | ORAL_TABLET | Freq: Every day | ORAL | 3 refills | Status: DC

## 2022-08-18 NOTE — Addendum Note (Signed)
Addended by: Geradine Girt D on: 08/18/2022 04:45 PM   Modules accepted: Orders

## 2022-08-18 NOTE — Telephone Encounter (Signed)
Received Fax RX request from  [facility], [address] AT [place] Phone: [phone]  Fax: [phone]      Medication - losartan (COZAAR) 100 MG tablet   Last Refill - 05/20/2022  Last OV - 05/19/22  Last CPE - 05/19/22  Next Appointment - 11/25/22

## 2022-08-27 ENCOUNTER — Emergency Department (HOSPITAL_COMMUNITY)
Admission: EM | Admit: 2022-08-27 | Discharge: 2022-08-27 | Disposition: A | Discharge status: Home / Self Care | Payer: Medicare Other | Attending: Emergency Medicine | Admitting: Emergency Medicine

## 2022-08-27 ENCOUNTER — Ambulatory Visit: Payer: Medicare Other

## 2022-08-27 ENCOUNTER — Encounter (HOSPITAL_COMMUNITY): Payer: Self-pay

## 2022-08-27 ENCOUNTER — Emergency Department (HOSPITAL_COMMUNITY): Payer: Medicare Other

## 2022-08-27 DIAGNOSIS — R1907 Generalized intra-abdominal and pelvic swelling, mass and lump: Secondary | ICD-10-CM | POA: Insufficient documentation

## 2022-08-27 DIAGNOSIS — I110 Hypertensive heart disease with heart failure: Secondary | ICD-10-CM | POA: Insufficient documentation

## 2022-08-27 DIAGNOSIS — R1900 Intra-abdominal and pelvic swelling, mass and lump, unspecified site: Secondary | ICD-10-CM

## 2022-08-27 DIAGNOSIS — K625 Hemorrhage of anus and rectum: Secondary | ICD-10-CM | POA: Diagnosis not present

## 2022-08-27 DIAGNOSIS — Z8673 Personal history of transient ischemic attack (TIA), and cerebral infarction without residual deficits: Secondary | ICD-10-CM | POA: Insufficient documentation

## 2022-08-27 DIAGNOSIS — I509 Heart failure, unspecified: Secondary | ICD-10-CM | POA: Insufficient documentation

## 2022-08-27 DIAGNOSIS — R339 Retention of urine, unspecified: Secondary | ICD-10-CM | POA: Insufficient documentation

## 2022-08-27 DIAGNOSIS — R55 Syncope and collapse: Secondary | ICD-10-CM | POA: Insufficient documentation

## 2022-08-27 DIAGNOSIS — Z79899 Other long term (current) drug therapy: Secondary | ICD-10-CM | POA: Insufficient documentation

## 2022-08-27 DIAGNOSIS — K649 Unspecified hemorrhoids: Secondary | ICD-10-CM | POA: Diagnosis not present

## 2022-08-27 DIAGNOSIS — Z7982 Long term (current) use of aspirin: Secondary | ICD-10-CM | POA: Diagnosis not present

## 2022-08-27 LAB — TYPE AND SCREEN
ABO/RH(D): O POS
Antibody Screen: NEGATIVE

## 2022-08-27 LAB — COMPREHENSIVE METABOLIC PANEL
ALT: 15 U/L (ref 0–44)
AST: 22 U/L (ref 15–41)
Albumin: 3.7 g/dL (ref 3.5–5.0)
Alkaline Phosphatase: 58 U/L (ref 38–126)
Anion gap: 9 (ref 5–15)
BUN: 18 mg/dL (ref 8–23)
CO2: 22 mmol/L (ref 22–32)
Calcium: 9.4 mg/dL (ref 8.9–10.3)
Chloride: 107 mmol/L (ref 98–111)
Creatinine, Ser: 1.1 mg/dL — ABNORMAL HIGH (ref 0.44–1.00)
GFR, Estimated: 47 mL/min — ABNORMAL LOW (ref >60)
Glucose, Bld: 123 mg/dL — ABNORMAL HIGH (ref 70–99)
Potassium: 3.8 mmol/L (ref 3.5–5.1)
Sodium: 138 mmol/L (ref 135–145)
Total Bilirubin: 0.7 mg/dL (ref 0.3–1.2)
Total Protein: 6.6 g/dL (ref 6.5–8.1)

## 2022-08-27 LAB — CBC
HCT: 36.4 % (ref 36.0–46.0)
Hemoglobin: 11 g/dL — ABNORMAL LOW (ref 12.0–15.0)
MCH: 29 pg (ref 26.0–34.0)
MCHC: 30.2 g/dL (ref 30.0–36.0)
MCV: 96 fL (ref 80.0–100.0)
Platelets: 291 10*3/uL (ref 150–400)
RBC: 3.79 MIL/uL — ABNORMAL LOW (ref 3.87–5.11)
RDW: 12.5 % (ref 11.5–15.5)
WBC: 6.7 10*3/uL (ref 4.0–10.5)
nRBC: 0 % (ref 0.0–0.2)

## 2022-08-27 LAB — URINALYSIS, ROUTINE W REFLEX MICROSCOPIC
Bacteria, UA: NONE SEEN
Bilirubin Urine: NEGATIVE
Glucose, UA: NEGATIVE mg/dL
Hgb urine dipstick: NEGATIVE
Ketones, ur: NEGATIVE mg/dL
Leukocytes,Ua: NEGATIVE
Nitrite: NEGATIVE
Protein, ur: 100 mg/dL — AB
Specific Gravity, Urine: 1.019 (ref 1.005–1.030)
pH: 8 (ref 5.0–8.0)

## 2022-08-27 LAB — TROPONIN I (HIGH SENSITIVITY): Troponin I (High Sensitivity): 2 ng/L (ref <18)

## 2022-08-27 MED ORDER — IOHEXOL 350 MG/ML SOLN
80.0000 mL | Freq: Once | INTRAVENOUS | Status: AC | PRN
  Administered 2022-08-27: 80 mL via INTRAVENOUS

## 2022-08-27 NOTE — ED Provider Notes (Signed)
[facility] EMERGENCY DEPARTMENT Provider Note   CSN: [id number] Arrival date & time: 08/27/22  1552     History  Chief Complaint  Patient presents with   Rectal Bleeding    Jolynne Spurgin Muzyka is a 88 y.o. female.  HPI     88 year old female with a history of CVA, hyperlipidemia, hypertension, heart failure, memory loss who presents with concern for rectal bleeding and near syncope.   Was walking to the car and felt lightheaded, had near syncopal episode and was eased to the ground Then had episode of emesis, no hematemesis, occurred at 330PM 20 minutes prior to that had episode of brown stool with bright red blood, bright red on tissue paper, drops in toilet, no rectal pain or pain with BM  Had episode of syncope one time in the past No hx of rectal bleeding or passing blood in stool Is on plavix No GI physician Feeling a little better now No abdominal pain No longer feeling nauseas, but is feeling lightheaded and weak still No dyspnea, just felt weak, weak all over, did not complain of chest pain at the time, reports she may have had some but history not clearly reliable due to dementia hx   History reviewed. No pertinent past medical history.  Home Medications Prior to Admission medications   Medication Sig Start Date End Date Taking? Authorizing Provider  acetaminophen (TYLENOL) 325 MG tablet Take 650 mg by mouth every 6 (six) hours as needed for moderate pain or mild pain.   Yes [provider]  albuterol (VENTOLIN HFA) 108 (90 Base) MCG/ACT inhaler INHALE 2 PUFFS INTO THE LUNGS EVERY 6 HOURS AS NEEDED FOR WHEEZING OR SHORTNESS OF BREATH Patient taking differently: Inhale 1-2 puffs into the lungs every 6 (six) hours as needed for wheezing or shortness of breath. 02/28/21  Yes Baxley, Cresenciano Lick, MD  amLODipine (NORVASC) 10 MG tablet TAKE 1 TABLET(10 MG) BY MOUTH DAILY Patient taking differently: Take 10 mg by mouth daily. 07/17/22  Yes BaxleyCresenciano Lick, MD  aspirin EC 81 MG tablet Take 81 mg by mouth daily. Swallow whole.   Yes [provider]  benzonatate (TESSALON) 100 MG capsule Take 1 capsule (100 mg total) by mouth 2 (two) times daily as needed for cough. 05/16/21  Yes Baxley, Cresenciano Lick, MD  cetirizine (ZYRTEC) 10 MG tablet Take 10 mg by mouth daily.   Yes [provider]  clopidogrel (PLAVIX) 75 MG tablet TAKE 1 TABLET(75 MG) BY MOUTH DAILY Patient taking differently: Take 75 mg by mouth daily. 04/18/22  Yes Baxley, Cresenciano Lick, MD  donepezil (ARICEPT) 5 MG tablet TAKE 1 TABLET(5 MG) BY MOUTH AT BEDTIME Patient taking differently: Take 5 mg by mouth at bedtime. 08/02/22  Yes Baxley, Cresenciano Lick, MD  latanoprost (XALATAN) 0.005 % ophthalmic solution Place 1 drop into both eyes at bedtime. 09/17/15  Yes [provider]  losartan (COZAAR) 100 MG tablet Take 1 tablet (100 mg total) by mouth daily. 08/18/22  Yes Baxley, Cresenciano Lick, MD  metFORMIN (GLUCOPHAGE) 500 MG tablet TAKE 1 TABLET(500 MG) BY MOUTH TWICE DAILY Patient taking differently: Take 500 mg by mouth in the morning and at bedtime. 07/17/22  Yes Baxley, Cresenciano Lick, MD  metoprolol succinate (TOPROL-XL) 100 MG 24 hr tablet TAKE 1 TABLET BY MOUTH EVERY DAY WITH OR IMMEDIATELY FOLLOWING A MEAL Patient taking differently: Take 100 mg by mouth See admin instructions. Take one tablet (100 mg) by mouth every day with or  immediately following a meal 07/17/22  Yes Baxley, Cresenciano Lick, MD  rosuvastatin (CRESTOR) 20 MG tablet TAKE 1 TABLET(20 MG) BY MOUTH DAILY Patient taking differently: Take 20 mg by mouth daily. 04/18/22  Yes Baxley, Cresenciano Lick, MD  Blood Glucose Monitoring Suppl (PRODIGY AUTOCODE BLOOD GLUCOSE) w/Device KIT Check glucose three times a day  Dispense lancets and test strips 05/08/20   Baxley, Cresenciano Lick, MD      Allergies    Penicillins    Review of Systems   Review of Systems  Physical Exam Updated Vital Signs BP 135/75   Pulse 82   Temp 98.7 F (37.1 C) (Oral)   Resp 18   Ht 4'  11" (1.499 m)   Wt 53.5 kg   SpO2 97%   BMI 23.83 kg/m  Physical Exam Vitals and nursing note reviewed.  Constitutional:      General: She is not in acute distress.    Appearance: She is well-developed. She is not diaphoretic.  HENT:     Head: Normocephalic and atraumatic.  Eyes:     Conjunctiva/sclera: Conjunctivae normal.  Cardiovascular:     Rate and Rhythm: Normal rate and regular rhythm.     Heart sounds: Normal heart sounds. No murmur heard.    No friction rub. No gallop.  Pulmonary:     Effort: Pulmonary effort is normal. No respiratory distress.     Breath sounds: Normal breath sounds. No wheezing or rales.  Abdominal:     General: There is no distension.     Palpations: Abdomen is soft.     Tenderness: There is abdominal tenderness (suprapubic fullnes and mass). There is no guarding.  Genitourinary:    Comments: Hemorroids present Light brown stool, with light brown BM present at time of evaluation in brief Musculoskeletal:        General: No tenderness.     Cervical back: Normal range of motion.  Skin:    General: Skin is warm and dry.     Findings: No erythema or rash.  Neurological:     Mental Status: She is alert and oriented to person, place, and time.     ED Results / Procedures / Treatments   Labs (all labs ordered are listed, but only abnormal results are displayed) Labs Reviewed  COMPREHENSIVE METABOLIC PANEL - Abnormal; Notable for the following components:      Result Value   Glucose, Bld 123 (*)    Creatinine, Ser 1.10 (*)    GFR, Estimated 47 (*)    All other components within normal limits  CBC - Abnormal; Notable for the following components:   RBC 3.79 (*)    Hemoglobin 11.0 (*)    All other components within normal limits  URINALYSIS, ROUTINE W REFLEX MICROSCOPIC - Abnormal; Notable for the following components:   APPearance HAZY (*)    Protein, ur 100 (*)    All other components within normal limits  POC OCCULT BLOOD, ED  TYPE AND  SCREEN  ABO/RH  TROPONIN I (HIGH SENSITIVITY)  TROPONIN I (HIGH SENSITIVITY)    EKG EKG Interpretation  Date/Time:  Wednesday August 27 2022 16:04:37 EDT Ventricular Rate:  81 PR Interval:  165 QRS Duration: 147 QT Interval:  428 QTC Calculation: 497 R Axis:   81 Text Interpretation: Sinus rhythm Right bundle branch block No significant change since last tracing Confirmed by Gareth Morgan [phone]) on 08/27/2022 4:32:11 PM  Radiology CT Angio Abd/Pel W and/or Wo Contrast  Result Date: 08/27/2022 CLINICAL  DATA:  88 year old with bright red blood per rectum. Near syncope. Provided history of aortic aneurysm surveillance. EXAM: CTA ABDOMEN AND PELVIS WITHOUT AND WITH CONTRAST TECHNIQUE: Multidetector CT imaging of the abdomen and pelvis was performed using the standard protocol during bolus administration of intravenous contrast. Multiplanar reconstructed images and MIPs were obtained and reviewed to evaluate the vascular anatomy. RADIATION DOSE REDUCTION: This exam was performed according to the departmental dose-optimization program which includes automated exposure control, adjustment of the mA and/or kV according to patient size and/or use of iterative reconstruction technique. CONTRAST:  61mL OMNIPAQUE IOHEXOL 350 MG/ML SOLN COMPARISON:  None Available. FINDINGS: VASCULAR Aorta: Normal caliber aorta without aneurysm, dissection, vasculitis or significant stenosis. Moderate aortic atherosclerosis. Celiac: Patent without evidence of aneurysm, dissection, vasculitis or significant stenosis. SMA: Patent without evidence of aneurysm, dissection, vasculitis or significant stenosis. Renals: Two left and single right renal arteries. All renal arteries are patent. IMA: Patent without evidence of aneurysm, dissection, vasculitis or significant stenosis. Inflow: Patent without evidence of aneurysm, dissection, vasculitis or significant stenosis. Proximal Outflow: Bilateral common femoral and  visualized portions of the superficial and profunda femoral arteries are patent without evidence of aneurysm, dissection, vasculitis or significant stenosis. Veins: Large cystic mass causes mild mass effect and flattening of the IVC. No evidence of thrombus. The portal and hepatic veins are patent. Review of the MIP images confirms the above findings. NON-VASCULAR Lower chest: Dependent opacities some of which are high density. No pleural effusion. Hepatobiliary: No focal liver abnormality is seen. No gallstones, gallbladder wall thickening, or biliary dilatation. Pancreas: No ductal dilatation or inflammation. Spleen: Normal in size without focal abnormality. Adrenals/Urinary Tract: Normal adrenal glands. Mild right hydronephrosis. No urolithiasis. There is small bilateral renal cysts as well as low-density lesions too small to characterize. No specific imaging follow-up is recommended. The urinary bladder is decompressed by Foley catheter. Stomach/Bowel: No extravasation of contrast into the GI tract to localize site of GI bleed. Moderate stool in the rectum. Colonic diverticulosis most prominent in the sigmoid, without diverticulitis. No small or large bowel obstruction or inflammatory change. There is mild enhancing wall thickening about the anterior gastric cardia, series 6, image 37. No adjacent fat stranding. Lymphatic: No enlarged lymph nodes in the abdomen or pelvis. There is no pelvic sidewall adenopathy. Reproductive: Large cystic mass in the pelvis extends into the abdomen anteriorly, in the pelvic midline. This measures 18.6 x 14.9 x 13.8 cm. The wall is partially calcified, minimal irregular. No internal septations. No definite soft tissue nodules. Neither ovary is visualized separate from this lesion. The uterus is not seen. Other: No ascites or omental thickening. Musculoskeletal: Diffuse bony under mineralization. This limits assessment for focal bone lesions, allowing for this, no acute osseous  abnormalities or suspicious bone lesion. IMPRESSION: VASCULAR 1. No extravasation of contrast into the GI tract to localize site of GI bleed. 2. Enhancing wall thickening about the anterior gastric cardia, without adjacent fat stranding. Recommend further evaluation with endoscopy. 3. Aortic and branch atherosclerosis without aortic aneurysm. NON-VASCULAR 1. Large cystic mass in the pelvis with peripheral calcifications, 18.6 x 14.9 x 13.8 cm. Given size this is suspicious for cystic neoplasm, possibly ovarian in origin, although neither ovary is definitively demonstrated on the current exam. Peripheral calcifications but no other internal complexity by CT. Alternatively a large peritoneal inclusion cyst is considered but typically are calcified. Recommend surgical consultation. 2. Mild right hydronephrosis without urolithiasis. This may be secondary to mass effect from the pelvic mass. 3.  Colonic diverticulosis without diverticulitis. Aortic Atherosclerosis (ICD10-I70.0). Electronically Signed   By: Keith Rake M.D.   On: 08/27/2022 21:00    Procedures Procedures    Medications Ordered in ED Medications  iohexol (OMNIPAQUE) 350 MG/ML injection 80 mL (80 mLs Intravenous Contrast Given 08/27/22 2017)    ED Course/ Medical Decision Making/ A&P                            88 year old female with a history of CVA, hyperlipidemia, hypertension, heart failure, memory loss who presents with concern for near syncope and episode of brown stool with bright red blood.  DDx for near syncope includes anemia/GI bleed, sepsis, cardiac arrhythmia, dehydration, AAA, medications.   Rectal exam with hemorrhoids, brown stool which was sent as POC however did not result. Given history would not be surprised if it was positive, however stool light brown in color and exam not consistent with melena, diverticular bleed or other significant GI bleeding.  Has had prior syncopal episode in the past. No dyspnea, doubt  PE.  EKG personally evaluated and interpreted by me without significant findings.  Labs personally evaluated and interpreted by me show stable hgb 11 in comparison to prior 10.8-11.6. Cr mildly elevated compared to prior 1.1 from .8. Troponin negative, doubt ACS, PE.  UA shows no infection.  CTA abdomen pelvis done given palpable abnormality on exam shows large cystic mass 18.6cm, diverticulosis, mild right hydronephrosis. Discussed findings with patient and daughter.  Observed for 6.5 hours in the ED with continued normal vitals, no syncope, no arrhythmia, no hematemesis or melena.  Stool normal color on exam and do not suspect clinically significant GI bleed or suspect that this was etiology of near-syncopal episode.   Suspect the blood described was secondary to hemorrhoid bleed, and by history does not sound consistent with diverticular bleed. Recommend follow up with PCP and Gi regarding this, return to ED if new or worsening bleeding.  Regarding near-syncope-suspect this episode was unrelated to the BRBPR described.  Has had prior syncopal episode in May for which she was seen in the ED, likely vasovagal and consider this or other dehydration. Recommend follow up with PCP regarding near-syncopal episode.  Regarding cystic mass-she, daughter, son do not feel she would desire surgery.  Recommend follow up with PCP for further discussion of goals of care and further work up as an outpatient as desired.  Patient discharged in stable condition with understanding of reasons to return.           Final Clinical Impression(s) / ED Diagnoses Final diagnoses:  Near syncope  Abdominal mass, unspecified abdominal location  Hemorrhoids, unspecified hemorrhoid type  Rectal bleeding    Rx / DC Orders ED Discharge Orders     None         Gareth Morgan, MD 08/27/22 2242

## 2022-08-27 NOTE — ED Notes (Signed)
Per MD Schlossman ok to discharge patient at this time after foley removal. Patient does not need to urinate prior to leaving department. All discharge instructions reviewed with patient and patient's daughter at bedside. Patient's daughter verbalized understanding of same and had no other questions. Patient stable at time of discharge and wheel chair provided to patient prior to leaving department.

## 2022-08-27 NOTE — ED Triage Notes (Signed)
Pt BIB [facility] from home C/O one episode of BRBPR witnessed by daughter. Per EMS, pt had BM, then began to walk outside and had near syncopal event, and fell to knees. Did not hit head. No LOC. Per EMS, hx prior stroke with no known deficits and hx dementia. A&O X 2 at baseline.

## 2022-08-27 NOTE — ED Notes (Signed)
Lab called to add on troponin 

## 2022-08-27 NOTE — ED Notes (Signed)
Patient transported to CT 

## 2022-08-28 ENCOUNTER — Encounter: Payer: Self-pay | Admitting: Internal Medicine

## 2022-09-01 ENCOUNTER — Telehealth: Payer: Self-pay

## 2022-09-01 NOTE — Telephone Encounter (Signed)
Spoke with the patient regarding the referral to GYN oncology. Patient scheduled as new patient with Dr Ernestina Patches on 09/15/2022. Patient given an arrival time of 12:30pm.  Explained to the patient the the doctor will perform a pelvic exam at this visit. Patient given the policy that no visitors under the 16 yrs are allowed in the [place]. Patient given the address/phone number for the clinic and that the center offers free valet service.

## 2022-09-02 ENCOUNTER — Ambulatory Visit (INDEPENDENT_AMBULATORY_CARE_PROVIDER_SITE_OTHER): Payer: Medicare Other

## 2022-09-02 VITALS — BP 124/72 | Temp 98.1°F

## 2022-09-02 DIAGNOSIS — Z7185 Encounter for immunization safety counseling: Secondary | ICD-10-CM

## 2022-09-02 DIAGNOSIS — Z23 Encounter for immunization: Secondary | ICD-10-CM

## 2022-09-11 ENCOUNTER — Encounter: Payer: Self-pay | Admitting: Psychiatry

## 2022-09-14 NOTE — Progress Notes (Unsigned)
GYNECOLOGIC ONCOLOGY NEW PATIENT CONSULTATION  Date of Service: 09/15/2022 Referring Provider: Elby Showers, MD [address] [place],  [place] [id number]***   ASSESSMENT AND PLAN: Amber Chang is a 88 y.o. woman with ***.  ***  A copy of this note was sent to the patient's referring provider.  Bernadene Bell, MD Gynecologic Oncology   Medical Decision Making I personally spent  TOTAL *** minutes face-to-face and non-face-to-face in the care of this patient, which includes all pre, intra, and post visit time on the date of service.  *** minutes spent reviewing records prior to the visit *** Minutes in patient contact      *** minutes in other billable services *** minutes charting , conferring with consultants etc.   ------------  CC: {GynOnc Types:[phone]}  HISTORY OF PRESENT ILLNESS:  Amber Chang is a 88 y.o. woman who is seen in consultation at the request of Baxley, Cresenciano Lick, MD for evaluation of ***.  10-15lbs over 6 months More diarrhea. No bloating, no pelvic Decreased Early satiety  Patient presents today with her daughter.  PAST MEDICAL HISTORY: Past Medical History:  Diagnosis Date   Diabetes mellitus without complication ([place])    Glaucoma    Hyperlipidemia    Hypertension     PAST SURGICAL HISTORY: Past Surgical History:  Procedure Laterality Date   ABDOMINAL HYSTERECTOMY     Fibroids in her 4s    OB/GYN HISTORY: OB History  Gravida Para Term Preterm AB Living  _0 SAB IAB Ectopic Multiple Live Births  1       4    # Outcome Date GA Lbr Len/2nd Weight Sex Delivery Anes PTL Lv  5 Term      Vag-Spont   LIV  4 Term      Vag-Spont   LIV  3 Term      Vag-Spont   LIV  2 Term      Vag-Spont   LIV  1 SAB               Age at menarche: Unknown Age at menopause: 35s Hx of HRT: No Hx of STI: No Last pap: Unsure History of abnormal pap smears: No  SCREENING STUDIES:  Last mammogram: 3 years ago Last  colonoscopy: None  MEDICATIONS:  Current Outpatient Medications:    acetaminophen (TYLENOL) 325 MG tablet, Take 650 mg by mouth every 6 (six) hours as needed for moderate pain or mild pain., Disp: , Rfl:    albuterol (VENTOLIN HFA) 108 (90 Base) MCG/ACT inhaler, INHALE 2 PUFFS INTO THE LUNGS EVERY 6 HOURS AS NEEDED FOR WHEEZING OR SHORTNESS OF BREATH (Patient taking differently: Inhale 1-2 puffs into the lungs every 6 (six) hours as needed for wheezing or shortness of breath.), Disp: 6.7 g, Rfl: PRN   amLODipine (NORVASC) 10 MG tablet, TAKE 1 TABLET(10 MG) BY MOUTH DAILY (Patient taking differently: Take 10 mg by mouth daily.), Disp: 90 tablet, Rfl: 1   aspirin EC 81 MG tablet, Take 81 mg by mouth daily. Swallow whole., Disp: , Rfl:    benzonatate (TESSALON) 100 MG capsule, Take 1 capsule (100 mg total) by mouth 2 (two) times daily as needed for cough., Disp: 60 capsule, Rfl: 5   Blood Glucose Monitoring Suppl (PRODIGY AUTOCODE BLOOD GLUCOSE) w/Device KIT, Check glucose three times a day  Dispense lancets and test strips, Disp: 1 kit, Rfl: 11   cetirizine (ZYRTEC) 10 MG tablet, Take 10 mg  by mouth daily., Disp: , Rfl:    clopidogrel (PLAVIX) 75 MG tablet, TAKE 1 TABLET(75 MG) BY MOUTH DAILY (Patient taking differently: Take 75 mg by mouth daily.), Disp: 90 tablet, Rfl: 3   donepezil (ARICEPT) 5 MG tablet, TAKE 1 TABLET(5 MG) BY MOUTH AT BEDTIME (Patient taking differently: Take 5 mg by mouth at bedtime.), Disp: 90 tablet, Rfl: 3   latanoprost (XALATAN) 0.005 % ophthalmic solution, Place 1 drop into both eyes at bedtime., Disp: , Rfl: 0   losartan (COZAAR) 100 MG tablet, Take 1 tablet (100 mg total) by mouth daily., Disp: 90 tablet, Rfl: 3   metFORMIN (GLUCOPHAGE) 500 MG tablet, TAKE 1 TABLET(500 MG) BY MOUTH TWICE DAILY (Patient taking differently: Take 500 mg by mouth in the morning and at bedtime.), Disp: 180 tablet, Rfl: 3   metoprolol succinate (TOPROL-XL) 100 MG 24 hr tablet, TAKE 1 TABLET BY  MOUTH EVERY DAY WITH OR IMMEDIATELY FOLLOWING A MEAL (Patient taking differently: Take 100 mg by mouth See admin instructions. Take one tablet (100 mg) by mouth every day with or immediately following a meal), Disp: 60 tablet, Rfl: 5   rosuvastatin (CRESTOR) 20 MG tablet, TAKE 1 TABLET(20 MG) BY MOUTH DAILY (Patient taking differently: Take 20 mg by mouth daily.), Disp: 90 tablet, Rfl: 3  ALLERGIES: Allergies  Allergen Reactions   Penicillins Hives    FAMILY HISTORY: Family History  Problem Relation Age of Onset   Breast cancer Neg Hx    Ovarian cancer Neg Hx    Endometrial cancer Neg Hx    Colon cancer Neg Hx     SOCIAL HISTORY: Social History   Socioeconomic History   Marital status: Widowed    Spouse name: Not on file   Number of children: Not on file   Years of education: Not on file   Highest education level: Not on file  Occupational History   Not on file  Tobacco Use   Smoking status: Never   Smokeless tobacco: Never  Substance and Sexual Activity   Alcohol use: Not Currently   Drug use: No   Sexual activity: Not Currently  Other Topics Concern   Not on file  Social History Narrative   Not on file   Social Determinants of Health   Financial Resource Strain: Not on file  Food Insecurity: Not on file  Transportation Needs: Not on file  Physical Activity: Not on file  Stress: Not on file  Social Connections: Not on file  Intimate Partner Violence: Not on file    REVIEW OF SYSTEMS: New patient intake form was reviewed.  Complete 10-system review is negative except for the following: Hearing loss, change in appetite, decreased concentration, fatigue, weight loss, allergy cough  PHYSICAL EXAM: BP 130/74 (BP Location: Left Arm, Patient Position: Sitting)   Pulse 76   Temp 98.6 F (37 C) (Oral)   Resp 20   Ht _0  (1.473 m)   Wt 106 lb (48.1 kg)   BMI 22.15 kg/m  Constitutional: No acute distress. Neuro/Psych: ***Alert, oriented.  Head and Neck:  ***Normocephalic, atraumatic. Neck symmetric without masses. Sclera anicteric.  Respiratory: ***Normal work of breathing. ***Clear to auscultation bilaterally. Cardiovascular: ***Regular rate and rhythm, no murmurs, rubs, or gallops. Breasts: {gi breast exam list:53019} Abdomen: ***Obese. ***Normoactive bowel sounds. ***Soft, non-distended, non-tender to palpation. ***No masses or hepatosplenomegaly appreciated. ***No evidence of hernia. ***No palpable fluid wave. ***incisions. Extremities: ***Grossly normal range of motion. Warm, well perfused. No edema bilaterally. Skin: ***No rashes or  lesions. Lymphatic: ***No cervical, supraclavicular, or inguinal adenopathy. Genitourinary: ***External genitalia without lesions. Urethral meatus ***without lesions or prolapse. On speculum exam, ***vagina and cervix without lesions. Bimanual exam reveals ***. ***Rectovaginal exam confirms the above findings and reveals normal sphincter tone and ***no masses or nodularity. Exam chaperoned by ***  LABORATORY AND RADIOLOGIC DATA: ***Outside medical records were reviewed to synthesize the above history, along with the history and physical obtained during the visit.  Outside ***laboratory, pathology, and imaging reports were reviewed, with pertinent results below.  ***I personally reviewed the outside images.  WBC  Date Value Ref Range Status  08/27/2022 6.7 4.0 - 10.5 K/uL Final   Hemoglobin  Date Value Ref Range Status  08/27/2022 11.0 (L) 12.0 - 15.0 g/dL Final   HCT  Date Value Ref Range Status  08/27/2022 36.4 36.0 - 46.0 % Final   Platelets  Date Value Ref Range Status  08/27/2022 291 150 - 400 K/uL Final   Creat  Date Value Ref Range Status  03/27/2022 0.81 0.60 - 0.95 mg/dL Final   Creatinine, Ser  Date Value Ref Range Status  08/27/2022 1.10 (H) 0.44 - 1.00 mg/dL Final   AST  Date Value Ref Range Status  08/27/2022 22 15 - 41 U/L Final   ALT  Date Value Ref Range Status  08/27/2022  15 0 - 44 U/L Final    ***

## 2022-09-15 ENCOUNTER — Encounter: Payer: Self-pay | Admitting: Psychiatry

## 2022-09-15 ENCOUNTER — Inpatient Hospital Stay: Payer: Medicare Other | Attending: Psychiatry | Admitting: Psychiatry

## 2022-09-15 VITALS — BP 130/74 | HR 76 | Temp 98.6°F | Resp 20 | Ht <= 58 in | Wt 106.0 lb

## 2022-09-15 DIAGNOSIS — H409 Unspecified glaucoma: Secondary | ICD-10-CM | POA: Insufficient documentation

## 2022-09-15 DIAGNOSIS — E785 Hyperlipidemia, unspecified: Secondary | ICD-10-CM | POA: Diagnosis not present

## 2022-09-15 DIAGNOSIS — Z79899 Other long term (current) drug therapy: Secondary | ICD-10-CM | POA: Diagnosis not present

## 2022-09-15 DIAGNOSIS — R6881 Early satiety: Secondary | ICD-10-CM | POA: Insufficient documentation

## 2022-09-15 DIAGNOSIS — I10 Essential (primary) hypertension: Secondary | ICD-10-CM | POA: Diagnosis not present

## 2022-09-15 DIAGNOSIS — Z7982 Long term (current) use of aspirin: Secondary | ICD-10-CM | POA: Diagnosis not present

## 2022-09-15 DIAGNOSIS — E119 Type 2 diabetes mellitus without complications: Secondary | ICD-10-CM

## 2022-09-15 DIAGNOSIS — Z7902 Long term (current) use of antithrombotics/antiplatelets: Secondary | ICD-10-CM | POA: Diagnosis not present

## 2022-09-15 DIAGNOSIS — R634 Abnormal weight loss: Secondary | ICD-10-CM | POA: Diagnosis not present

## 2022-09-15 DIAGNOSIS — F0390 Unspecified dementia without behavioral disturbance: Secondary | ICD-10-CM

## 2022-09-15 DIAGNOSIS — K5909 Other constipation: Secondary | ICD-10-CM | POA: Insufficient documentation

## 2022-09-15 DIAGNOSIS — R1900 Intra-abdominal and pelvic swelling, mass and lump, unspecified site: Secondary | ICD-10-CM | POA: Diagnosis not present

## 2022-09-15 DIAGNOSIS — R630 Anorexia: Secondary | ICD-10-CM | POA: Insufficient documentation

## 2022-09-15 DIAGNOSIS — R197 Diarrhea, unspecified: Secondary | ICD-10-CM | POA: Insufficient documentation

## 2022-09-15 DIAGNOSIS — Z7984 Long term (current) use of oral hypoglycemic drugs: Secondary | ICD-10-CM | POA: Diagnosis not present

## 2022-09-15 DIAGNOSIS — E7849 Other hyperlipidemia: Secondary | ICD-10-CM

## 2022-09-15 NOTE — Patient Instructions (Addendum)
It was a pleasure to see you in clinic today. - We have placed a request for the interventional radiology group to drain the cyst. - I recommend that you start taking Miralax daily. - Return visit planned for 1 month.  You will receive a phone call from the hospital to arrange for drainage of the pelvic cyst.   Thank you very much for allowing me to provide care for you today.  I appreciate your confidence in choosing our Gynecologic Oncology team at [facility].  If you have any questions about your visit today please call our office or send [place] a MyChart message and we will get back to you as soon as possible.

## 2022-09-16 ENCOUNTER — Encounter: Payer: Self-pay | Admitting: Psychiatry

## 2022-09-16 ENCOUNTER — Encounter: Payer: Self-pay | Admitting: General Practice

## 2022-09-16 NOTE — Progress Notes (Signed)
Myra Rude, Marva Panda, NT [place] and [place],  This is for an US Aspiration, please schedule.  Thanks!       Previous Messages    ----- Message ----- From: Criselda Peaches, MD Sent: 09/16/2022   8:59 AM EST To: Anell Barr Subject: RE: ?Aspiration                                [place]  HKM   ----- Message ----- From: Anell Barr Sent: 09/15/2022   3:31 PM EST To: Criselda Peaches, MD Subject: FW: ?Aspiration                                IN IR OR ANOTHER MODALITY?  ----- Message ----- From: Criselda Peaches, MD Sent: 09/15/2022   2:36 PM EST To: Anell Barr; Ir Procedure Requests Subject: RE: ?Aspiration                                Approved for attempt at palliative aspiration.  Fluid to be sent for cytology, but no drain left in place.  HKM   ----- Message ----- From: Anell Barr Sent: 09/15/2022   2:22 PM EST To: Ir Procedure Requests Subject: ?Aspiration                                    PROCEDURE: IR RAD EVAL  REASON: Simple pelvic mass; paliative drainage - Please drain fluid but do not leave a drain in this mass; please send fluid for cytology. For palliation of symptoms.  HISTORY: 10/18 CT ANGIO ABD/PEL  PHYSICIAN: Bernadene Bell  CONTACT# [place]

## 2022-09-16 NOTE — Progress Notes (Signed)
Elby Showers, MD  Allen Kell, NT [place]       Previous Messages    ----- Message ----- From: Allen Kell, NT Sent: 09/16/2022  10:02 AM EST To: Elby Showers, MD Subject: Hold Plavix                                    Good Morning Pt is scheduled for US Aspiration and I needed permission for pt to hold Plavix for 5 days prior to appt  Thanks

## 2022-09-22 ENCOUNTER — Inpatient Hospital Stay: Payer: Medicare Other

## 2022-09-22 ENCOUNTER — Other Ambulatory Visit: Payer: Self-pay

## 2022-09-22 NOTE — Progress Notes (Signed)
Amber Chang  Patient presented to [facility]  to review and complete healthcare advance directives.  Clinical Social Worker met with patient and her daughter, Amber Chang.  The patient designated Amber Chang as their primary healthcare agent and Amber Chang as their secondary agent.  Patient also completed healthcare living will.    Documents were notarized and copies made for patient/family. Clinical Social Worker will send documents to medical records to be scanned into patient's chart. Clinical Social Worker encouraged patient/family to contact with any additional questions or concerns.   Margaree Mackintosh, LCSW Clinical Social Worker [facility]

## 2022-10-01 ENCOUNTER — Other Ambulatory Visit: Payer: Self-pay | Admitting: Radiology

## 2022-10-01 DIAGNOSIS — R1900 Intra-abdominal and pelvic swelling, mass and lump, unspecified site: Secondary | ICD-10-CM

## 2022-10-03 ENCOUNTER — Other Ambulatory Visit: Payer: Self-pay | Admitting: Student

## 2022-10-06 ENCOUNTER — Other Ambulatory Visit: Payer: Self-pay | Admitting: Psychiatry

## 2022-10-06 ENCOUNTER — Encounter (HOSPITAL_COMMUNITY): Payer: Self-pay

## 2022-10-06 ENCOUNTER — Ambulatory Visit (HOSPITAL_COMMUNITY)
Admission: RE | Admit: 2022-10-06 | Discharge: 2022-10-06 | Disposition: A | Discharge status: Home / Self Care | Payer: Medicare Other | Source: Ambulatory Visit | Attending: Psychiatry | Admitting: Psychiatry

## 2022-10-06 ENCOUNTER — Other Ambulatory Visit: Payer: Self-pay

## 2022-10-06 DIAGNOSIS — I10 Essential (primary) hypertension: Secondary | ICD-10-CM | POA: Diagnosis not present

## 2022-10-06 DIAGNOSIS — F0390 Unspecified dementia without behavioral disturbance: Secondary | ICD-10-CM

## 2022-10-06 DIAGNOSIS — E785 Hyperlipidemia, unspecified: Secondary | ICD-10-CM | POA: Insufficient documentation

## 2022-10-06 DIAGNOSIS — E7849 Other hyperlipidemia: Secondary | ICD-10-CM

## 2022-10-06 DIAGNOSIS — E119 Type 2 diabetes mellitus without complications: Secondary | ICD-10-CM | POA: Diagnosis not present

## 2022-10-06 DIAGNOSIS — C569 Malignant neoplasm of unspecified ovary: Secondary | ICD-10-CM | POA: Diagnosis not present

## 2022-10-06 DIAGNOSIS — C495 Malignant neoplasm of connective and soft tissue of pelvis: Secondary | ICD-10-CM | POA: Diagnosis not present

## 2022-10-06 DIAGNOSIS — Z7982 Long term (current) use of aspirin: Secondary | ICD-10-CM | POA: Diagnosis not present

## 2022-10-06 DIAGNOSIS — R1900 Intra-abdominal and pelvic swelling, mass and lump, unspecified site: Secondary | ICD-10-CM | POA: Diagnosis present

## 2022-10-06 LAB — PROTIME-INR
INR: 0.9 (ref 0.8–1.2)
Prothrombin Time: 12.5 seconds (ref 11.4–15.2)

## 2022-10-06 LAB — CBC
HCT: 35.4 % — ABNORMAL LOW (ref 36.0–46.0)
Hemoglobin: 11.3 g/dL — ABNORMAL LOW (ref 12.0–15.0)
MCH: 30.5 pg (ref 26.0–34.0)
MCHC: 31.9 g/dL (ref 30.0–36.0)
MCV: 95.4 fL (ref 80.0–100.0)
Platelets: 299 10*3/uL (ref 150–400)
RBC: 3.71 MIL/uL — ABNORMAL LOW (ref 3.87–5.11)
RDW: 13.2 % (ref 11.5–15.5)
WBC: 7 10*3/uL (ref 4.0–10.5)
nRBC: 0 % (ref 0.0–0.2)

## 2022-10-06 LAB — GLUCOSE, CAPILLARY
Glucose-Capillary: 119 mg/dL — ABNORMAL HIGH (ref 70–99)
Glucose-Capillary: 131 mg/dL — ABNORMAL HIGH (ref 70–99)

## 2022-10-06 MED ORDER — FENTANYL CITRATE (PF) 100 MCG/2ML IJ SOLN
INTRAMUSCULAR | Status: AC
  Filled 2022-10-06: qty 2

## 2022-10-06 MED ORDER — SODIUM CHLORIDE 0.9 % IV SOLN: INTRAVENOUS | Status: DC

## 2022-10-06 MED ORDER — MIDAZOLAM HCL 2 MG/2ML IJ SOLN
INTRAMUSCULAR | Status: AC
  Filled 2022-10-06: qty 2

## 2022-10-06 MED ORDER — FENTANYL CITRATE (PF) 100 MCG/2ML IJ SOLN
INTRAMUSCULAR | Status: AC | PRN
  Administered 2022-10-06: 25 ug via INTRAVENOUS

## 2022-10-06 MED ORDER — MIDAZOLAM HCL 2 MG/2ML IJ SOLN
INTRAMUSCULAR | Status: AC | PRN
  Administered 2022-10-06 (×2): .5 mg via INTRAVENOUS

## 2022-10-06 MED ORDER — LIDOCAINE HCL 1 % IJ SOLN: 10.0000 mL | Freq: Once | INTRAMUSCULAR | Status: DC

## 2022-10-06 NOTE — H&P (Signed)
Chief Complaint: Patient was seen in consultation today for pelvic fluid collection  Referring Physician(s): Newton,Meredith  Supervising Physician: Michaelle Birks  Patient Status: [facility] - Out-pt  History of Present Illness: Amber Chang is a 88 y.o. female with past medical history of DM, HTN, HLD and diagnosis of ovarian cancer with large cystic pelvic mass suspicious for cystic neoplasm.  The patient and her daughter met with GYN ONC to discuss diagnosis and treatment options, however patient and family wish to proceed with palliative care/procedures only.  In light of this request, the cystic mass is large and cumbersome creating colonic compression and likely constipation.  She is interested in proceed with drainage of the cystic mass.   Patient presents to [facility] Radiology in her usual state of health today.  She has been NPO.  She does not take blood thinners.  She has some baseline cognitive impairment.  Her daughter is her POA.   Past Medical History:  Diagnosis Date   Diabetes mellitus without complication ([place])    Glaucoma    Hyperlipidemia    Hypertension     Past Surgical History:  Procedure Laterality Date   ABDOMINAL HYSTERECTOMY     Fibroids in her 18s    Allergies: Penicillins  Medications: Prior to Admission medications   Medication Sig Start Date End Date Taking? Authorizing Provider  acetaminophen (TYLENOL) 500 MG tablet Take 500 mg by mouth every 6 (six) hours as needed for moderate pain.   Yes [provider]  albuterol (VENTOLIN HFA) 108 (90 Base) MCG/ACT inhaler INHALE 2 PUFFS INTO THE LUNGS EVERY 6 HOURS AS NEEDED FOR WHEEZING OR SHORTNESS OF BREATH 02/28/21  Yes Baxley, Cresenciano Lick, MD  amLODipine (NORVASC) 10 MG tablet TAKE 1 TABLET(10 MG) BY MOUTH DAILY Patient taking differently: Take 10 mg by mouth daily. 07/17/22  Yes BaxleyCresenciano Lick, MD  aspirin EC 81 MG tablet Take 81 mg by mouth daily. Swallow whole.   Yes [provider]   benzonatate (TESSALON) 100 MG capsule Take 1 capsule (100 mg total) by mouth 2 (two) times daily as needed for cough. 05/16/21  Yes Baxley, Cresenciano Lick, MD  cetirizine (ZYRTEC) 10 MG tablet Take 10 mg by mouth daily.   Yes [provider]  clopidogrel (PLAVIX) 75 MG tablet TAKE 1 TABLET(75 MG) BY MOUTH DAILY 04/18/22  Yes Baxley, Cresenciano Lick, MD  donepezil (ARICEPT) 5 MG tablet TAKE 1 TABLET(5 MG) BY MOUTH AT BEDTIME Patient taking differently: Take 5 mg by mouth at bedtime. 08/02/22  Yes Baxley, Cresenciano Lick, MD  latanoprost (XALATAN) 0.005 % ophthalmic solution Place 1 drop into both eyes at bedtime. 09/17/15  Yes [provider]  losartan (COZAAR) 100 MG tablet Take 1 tablet (100 mg total) by mouth daily. 08/18/22  Yes Baxley, Cresenciano Lick, MD  metFORMIN (GLUCOPHAGE) 500 MG tablet TAKE 1 TABLET(500 MG) BY MOUTH TWICE DAILY 07/17/22  Yes Baxley, Cresenciano Lick, MD  metoprolol succinate (TOPROL-XL) 100 MG 24 hr tablet TAKE 1 TABLET BY MOUTH EVERY DAY WITH OR IMMEDIATELY FOLLOWING A MEAL 07/17/22  Yes Baxley, Cresenciano Lick, MD  polyethylene glycol (MIRALAX / GLYCOLAX) 17 g packet Take 17 g by mouth daily.   Yes [provider]  rosuvastatin (CRESTOR) 20 MG tablet TAKE 1 TABLET(20 MG) BY MOUTH DAILY Patient taking differently: Take 20 mg by mouth daily. 04/18/22  Yes Baxley, Cresenciano Lick, MD  Skin Protectants, Misc. (ENDIT EX) Apply 1 Application topically daily.   Yes [provider]  Blood Glucose Monitoring Suppl (PRODIGY AUTOCODE BLOOD GLUCOSE) w/Device KIT Check glucose three times a day  Dispense lancets and test strips 05/08/20   Baxley, Cresenciano Lick, MD     Family History  Problem Relation Age of Onset   Breast cancer Neg Hx    Ovarian cancer Neg Hx    Endometrial cancer Neg Hx    Colon cancer Neg Hx     Social History   Socioeconomic History   Marital status: Widowed    Spouse name: Not on file   Number of children: Not on file   Years of education: Not on file   Highest education level: Not on file   Occupational History   Not on file  Tobacco Use   Smoking status: Never   Smokeless tobacco: Never  Substance and Sexual Activity   Alcohol use: Not Currently   Drug use: No   Sexual activity: Not Currently  Other Topics Concern   Not on file  Social History Narrative   Not on file   Social Determinants of Health   Financial Resource Strain: Not on file  Food Insecurity: Not on file  Transportation Needs: Not on file  Physical Activity: Not on file  Stress: Not on file  Social Connections: Not on file     Review of Systems: A 12 point ROS discussed and pertinent positives are indicated in the HPI above.  All other systems are negative.  Review of Systems  Constitutional:  Negative for fatigue and fever.  Respiratory:  Negative for cough and shortness of breath.   Cardiovascular:  Negative for chest pain.  Gastrointestinal:  Negative for abdominal pain, nausea and vomiting.  Musculoskeletal:  Negative for back pain.  Psychiatric/Behavioral:  Negative for behavioral problems and confusion.     Vital Signs: BP (!) 148/81   Pulse 78   Temp 97.8 F (36.6 C) (Temporal)   Resp 18   Ht _0  (1.473 m)   Wt 108 lb (49 kg)   SpO2 97%   BMI 22.57 kg/m   Physical Exam Vitals and nursing note reviewed.  Constitutional:      General: She is not in acute distress.    Appearance: Normal appearance. She is not ill-appearing.  HENT:     Mouth/Throat:     Mouth: Mucous membranes are moist.     Pharynx: Oropharynx is clear.  Cardiovascular:     Rate and Rhythm: Normal rate and regular rhythm.  Pulmonary:     Effort: Pulmonary effort is normal. No respiratory distress.     Breath sounds: Normal breath sounds.  Abdominal:     General: Abdomen is flat.     Palpations: Abdomen is soft.  Skin:    General: Skin is warm and dry.  Neurological:     General: No focal deficit present.     Mental Status: She is alert. Mental status is at baseline.  Psychiatric:        Mood  and Affect: Mood normal.        Behavior: Behavior normal.        Thought Content: Thought content normal.        Judgment: Judgment normal.      MD Evaluation Airway: WNL Heart: WNL Abdomen: WNL Chest/ Lungs: WNL ASA  Classification: 3 Mallampati/Airway Score: Two   Imaging: No results found.  Labs:  CBC: Recent Labs    03/24/22 0010 03/24/22 0032 03/27/22 1244 08/27/22 1604 10/06/22 1113  WBC 6.8  --  6.9 6.7 7.0  HGB 10.8* 11.6* 11.6* 11.0* 11.3*  HCT 35.3* 34.0* 36.4 36.4 35.4*  PLT 296  --  325 291 299    COAGS: No results for input(s): "INR", "APTT" in the last 8760 hours.  BMP: Recent Labs    03/24/22 0010 03/24/22 0032 03/27/22 1243 08/27/22 1604  NA 136 136 138 138  K 3.4* 3.4* 4.0 3.8  CL 104 101 101 107  CO2 24  --  29 22  GLUCOSE 115* 114* 70 123*  BUN _0 CALCIUM 9.5  --  9.5 9.4  CREATININE 0.92 0.80 0.81 1.10*  GFRNONAA 58*  --   --  47*    LIVER FUNCTION TESTS: Recent Labs    11/19/21 0928 03/24/22 0010 03/27/22 1243 08/27/22 1604  BILITOT 0.5 0.6 0.5 0.7  AST _1 ALT _2 ALKPHOS  --  58  --  58  PROT 6.4 6.3* 6.7 6.6  ALBUMIN  --  3.4*  --  3.7    TUMOR MARKERS: No results for input(s): "AFPTM", "CEA", "CA199", "CHROMGRNA" in the last 8760 hours.  Assessment and Plan: Patient with past medical history of HTN, HLD, DM presents with complaint of cystic pelvic mass with large fluid component.  IR consulted for aspiration at the request of Dr. Ernestina Patches. Case reviewed by Dr. Laurence Ferrari who approves patient for procedure.  Patient presents today in their usual state of health.  She has been NPO and is not currently on blood thinners.   Risks and benefits discussed with the patient including bleeding, infection, damage to adjacent structures, bowel perforation/fistula connection, and sepsis.  All of the patient's questions were answered, patient is agreeable to proceed. Consent signed and in  chart.  Thank you for this interesting consult.  I greatly enjoyed meeting TANARA TURVEY and look forward to participating in their care.  A copy of this report was sent to the requesting provider on this date.  Electronically Signed: Docia Barrier, PA 10/06/2022, 11:49 AM   I spent a total of  30 Minutes   in face to face in clinical consultation, greater than 50% of which was counseling/coordinating care for cystic pelvic mass.

## 2022-10-06 NOTE — Procedures (Addendum)
Vascular and Interventional Radiology Procedure Note  Patient: Amber Chang DOB: May 16, 1927 Medical Record Number: [id number] Note Date/Time: 10/06/22 3:01 PM   Performing Physician: Michaelle Birks, MD Assistant(s): None  Diagnosis: Pelvic cystic lesion  Procedure: ASPIRATION OF PELVIC CYSTIC MASS  Anesthesia: Conscious Sedation Complications: None Estimated Blood Loss: Minimal Specimens: Sent for Cytology and Fluid Creatinine  Findings:  Successful CT-guided aspiration of pelvic cystic lesion. 1.8 L of clear yellow fluid was aspirated   See detailed procedure note with images in PACS. The patient tolerated the procedure well without incident or complication and was returned to Recovery in stable condition.    Michaelle Birks, MD [facility] Interventional Radiology [facility]   Pager. [place]

## 2022-10-08 LAB — CYTOLOGY - NON PAP

## 2022-10-09 ENCOUNTER — Encounter: Payer: Self-pay | Admitting: Internal Medicine

## 2022-10-09 ENCOUNTER — Telehealth: Payer: Self-pay | Admitting: Psychiatry

## 2022-10-09 ENCOUNTER — Ambulatory Visit (INDEPENDENT_AMBULATORY_CARE_PROVIDER_SITE_OTHER): Payer: Medicare Other | Admitting: Internal Medicine

## 2022-10-09 VITALS — BP 118/78 | HR 76 | Ht <= 58 in | Wt 102.0 lb

## 2022-10-09 DIAGNOSIS — R896 Abnormal cytological findings in specimens from other organs, systems and tissues: Secondary | ICD-10-CM

## 2022-10-09 DIAGNOSIS — K5900 Constipation, unspecified: Secondary | ICD-10-CM | POA: Diagnosis not present

## 2022-10-09 DIAGNOSIS — R1900 Intra-abdominal and pelvic swelling, mass and lump, unspecified site: Secondary | ICD-10-CM | POA: Diagnosis not present

## 2022-10-09 DIAGNOSIS — K625 Hemorrhage of anus and rectum: Secondary | ICD-10-CM

## 2022-10-09 DIAGNOSIS — R935 Abnormal findings on diagnostic imaging of other abdominal regions, including retroperitoneum: Secondary | ICD-10-CM

## 2022-10-09 NOTE — Telephone Encounter (Signed)
Called pt's daughter regarding cytology results from cyst drainage, c/w malignancy. Unfortuantely these results were not available to me until today. This was discussed with her at her visit with GI this morning. She was understanding. We will discuss more at the follow-up on Monday.

## 2022-10-09 NOTE — Progress Notes (Signed)
Pre procedure protcol

## 2022-10-09 NOTE — Patient Instructions (Signed)
_______________________________________________________  If you are age 86 or older, your body mass index should be between 23-30. Your Body mass index is 21.32 kg/m. If this is out of the aforementioned range listed, please consider follow up with your Primary Care Provider.  If you are age 66 or younger, your body mass index should be between 19-25. Your Body mass index is 21.32 kg/m. If this is out of the aformentioned range listed, please consider follow up with your Primary Care Provider.   ________________________________________________________  The [facility] providers would like to encourage you to use [facility] to communicate with providers for non-urgent requests or questions.  Due to long hold times on the telephone, sending your provider a message by [facility] may be a faster and more efficient way to get a response.  Please allow 48 business hours for a response.  Please remember that this is for non-urgent requests.  _______________________________________________________  Please follow up as needed

## 2022-10-09 NOTE — Progress Notes (Signed)
HISTORY OF PRESENT ILLNESS:  Amber Chang is a late full 88 y.o. female with type 2 diabetes, hyperlipidemia, hypertension, and prior stroke on Plavix who is sent today by the emergency room for evaluation of constipation and rectal bleeding.  Patient is new to this practice.  She is accompanied by her daughter.  She denies being seen by gastroenterology previously.  I have reviewed emergency room visit, laboratories, radiology, interventional radiology, and cytology.  The patient presented to the emergency room August 27, 2022 after syncopal episode.  She had an episode of nonbloody emesis.  Also, had been constipated and straining.  Prior to her emergency room visit she did pass a stool that was brown but had some bright red blood.  Hemoglobin at that time was 11.0.  Similar to baseline.  A CT angiogram of the abdomen pelvis with and without contrast was performed.  There was no active GI bleeding.  She was said to have enhancing wall thickening about the anterior gastric cardia.  She was also noted to have a large cystic mass in the pelvis with peripheral calcifications.  The mass measured 18.6 x 14.9 x 13.8.  This was felt possibly a cystic neoplasm.  She is status post hysterectomy.  There was mild hydronephrosis.  Incidental diverticulosis without diverticulitis.  She was subsequently seen by gynecologic oncology, Dr. Bernadene Bell, September 15, 2022.  Because the cystic mass appeared to be causing some compression of the colon cyst drainage was ordered.  Patient was also placed on MiraLAX for constipation.  Cyst drainage was performed October 06, 2022.  The cytology returned POSITIVE FOR MALIGNANCY.  I personally contacted pathology this morning, Dr. Donneta Romberg, who confirmed malignant cells in the cytologic specimen.  The patient and her daughter were not aware of the cytology results.  She tells me that she does have follow-up with gynecologic oncology in 4 days.  Since being placed on MiraLAX, 1  dose daily, she has had regular bowel movements.  Occasionally loose.  No further rectal bleeding.  He reports decreased appetite and significant weight loss in recent months.  She does feel better after cyst aspiration of 1.8 L.  No active GI complaints at this time.   REVIEW OF SYSTEMS:  All non-GI ROS negative unless otherwise stated in the HPI.  Past Medical History:  Diagnosis Date   Diabetes mellitus without complication ([place])    Glaucoma    Hyperlipidemia    Hypertension     Past Surgical History:  Procedure Laterality Date   ABDOMINAL HYSTERECTOMY     Fibroids in her 69s   CYST EXCISION      Social History Amber Chang  reports that she has never smoked. She has never used smokeless tobacco. She reports that she does not currently use alcohol. She reports that she does not use drugs.  family history includes Colon cancer in her brother.  Allergies  Allergen Reactions   Penicillins Hives       PHYSICAL EXAMINATION: Vital signs: BP 118/78   Pulse 76   Ht _0  (1.473 m)   Wt 102 lb (46.3 kg)   SpO2 96%   BMI 21.32 kg/m   Constitutional: generally well-appearing, no acute distress Psychiatric: alert and oriented x3, cooperative Eyes: extraocular movements intact, anicteric, conjunctiva pink Mouth: oral pharynx moist, no lesions Neck: supple no lymphadenopathy Cardiovascular: heart regular rate and rhythm, no murmur Lungs: clear to auscultation bilaterally Abdomen: soft, nontender, nondistended, no obvious ascites, no peritoneal signs, normal bowel  sounds, no organomegaly Rectal: Omitted Extremities: no clubbing or cyanosis.  2+ lower extremity edema bilaterally Skin: no relevant lesions on visible extremities Neuro: No focal deficits.  Cranial nerves intact  ASSESSMENT:  1.  Transient minor rectal bleeding with straining and constipation.  Likely benign anorectal pathology such as hemorrhoid or fissure.  Resolved after 1 isolated episode. 2.   Constipation.  Improved MiraLAX 3.  Malignant cystic mass of the pelvis being followed by gynecologic oncology   PLAN:  1.  I reviewed the cytologic findings with the patient and her daughter.  Further plans regarding management of this issue per Dr. Ernestina Patches. 2.  Continue MiraLAX.  Titrate to need 3.  Resume care with Dr. Ernestina Patches and Dr. Renold Genta.  GI follow-up as needed A total time of 60 minutes was spent preparing to see the patient, reviewing the myriad of data including laboratories, x-rays, interventional radiology, cytology, and ER evaluation.  Obtaining comprehensive history, performing medically appropriate physical examination, counseling and educating the patient and her daughter regarding the above listed issues, directing medical therapy regarding her constipation, and documenting clinical information in the health record.

## 2022-10-10 ENCOUNTER — Encounter: Payer: Medicare Other | Admitting: General Practice

## 2022-10-13 ENCOUNTER — Other Ambulatory Visit: Payer: Self-pay

## 2022-10-13 ENCOUNTER — Inpatient Hospital Stay: Payer: Medicare Other | Attending: Psychiatry | Admitting: Psychiatry

## 2022-10-13 VITALS — BP 133/84 | HR 84 | Temp 99.0°F | Resp 14 | Ht <= 58 in | Wt 102.0 lb

## 2022-10-13 DIAGNOSIS — Z66 Do not resuscitate: Secondary | ICD-10-CM | POA: Insufficient documentation

## 2022-10-13 DIAGNOSIS — C569 Malignant neoplasm of unspecified ovary: Secondary | ICD-10-CM

## 2022-10-13 DIAGNOSIS — R1900 Intra-abdominal and pelvic swelling, mass and lump, unspecified site: Secondary | ICD-10-CM

## 2022-10-13 DIAGNOSIS — Z7189 Other specified counseling: Secondary | ICD-10-CM

## 2022-10-13 NOTE — Patient Instructions (Signed)
It was a pleasure to see you in clinic today. - Sounds like the drainage helped some of your symptoms - Return visit planned for 3 months.  Thank you very much for allowing me to provide care for you today.  I appreciate your confidence in choosing our Gynecologic Oncology team at [facility].  If you have any questions about your visit today please call our office or send [place] a MyChart message and we will get back to you as soon as possible.

## 2022-10-13 NOTE — Progress Notes (Unsigned)
Gynecologic Oncology Return Clinic Visit  Date of Service: 10/13/2022 Referring Provider: Elby Showers, MD [address] [place],  [place] [id number]***  Assessment & Plan: Amber Chang is a 88 y.o. woman with Stage *** who presents for ***.  *** Brought in paperwork in middle of November  Daughter hcpoa Dnr/dni  Palliative care referral Talked about hospice   ***VTE Prophylaxis: - [place] score = ***  ***Preventative Screening: {Preventative screening:28493}  RTC ***.  Bernadene Bell, MD Gynecologic Oncology   Medical Decision Making I personally spent  TOTAL *** minutes face-to-face and non-face-to-face in the care of this patient, which includes all pre, intra, and post visit time on the date of service.  *** minutes spent reviewing records prior to the visit *** Minutes in patient contact      *** minutes in other billable services *** minutes charting , conferring with consultants etc.   ----------------------- Reason for Visit: ***  Treatment History: Oncology History   No history exists.    Interval History: *** Bowel movements doing better (was initially loose now stable and not constipation) Less pain Actually finishing food now Legs hurting - new  Notes joint pain, fatigue, legs hurting  Past Medical/Surgical History: Past Medical History:  Diagnosis Date   Diabetes mellitus without complication ([place])    Glaucoma    Hyperlipidemia    Hypertension     Past Surgical History:  Procedure Laterality Date   ABDOMINAL HYSTERECTOMY     Fibroids in her 64s   CYST EXCISION      Family History  Problem Relation Age of Onset   Colon cancer Brother    Breast cancer Neg Hx    Ovarian cancer Neg Hx    Endometrial cancer Neg Hx    Stomach cancer Neg Hx    Esophageal cancer Neg Hx     Social History   Socioeconomic History   Marital status: Widowed    Spouse name: Not on file   Number of children: Not on file   Years of  education: Not on file   Highest education level: Not on file  Occupational History   Not on file  Tobacco Use   Smoking status: Never   Smokeless tobacco: Never  Vaping Use   Vaping Use: Never used  Substance and Sexual Activity   Alcohol use: Not Currently   Drug use: No   Sexual activity: Not Currently  Other Topics Concern   Not on file  Social History Narrative   Not on file   Social Determinants of Health   Financial Resource Strain: Not on file  Food Insecurity: Not on file  Transportation Needs: Not on file  Physical Activity: Not on file  Stress: Not on file  Social Connections: Not on file    Current Medications:  Current Outpatient Medications:    acetaminophen (TYLENOL) 500 MG tablet, Take 500 mg by mouth every 6 (six) hours as needed for moderate pain., Disp: , Rfl:    albuterol (VENTOLIN HFA) 108 (90 Base) MCG/ACT inhaler, INHALE 2 PUFFS INTO THE LUNGS EVERY 6 HOURS AS NEEDED FOR WHEEZING OR SHORTNESS OF BREATH, Disp: 6.7 g, Rfl: PRN   amLODipine (NORVASC) 10 MG tablet, TAKE 1 TABLET(10 MG) BY MOUTH DAILY (Patient taking differently: Take 10 mg by mouth daily.), Disp: 90 tablet, Rfl: 1   aspirin EC 81 MG tablet, Take 81 mg by mouth daily. Swallow whole., Disp: , Rfl:    benzonatate (TESSALON) 100 MG capsule, Take 1 capsule (  100 mg total) by mouth 2 (two) times daily as needed for cough., Disp: 60 capsule, Rfl: 5   Blood Glucose Monitoring Suppl (PRODIGY AUTOCODE BLOOD GLUCOSE) w/Device KIT, Check glucose three times a day  Dispense lancets and test strips, Disp: 1 kit, Rfl: 11   cetirizine (ZYRTEC) 10 MG tablet, Take 10 mg by mouth daily., Disp: , Rfl:    clopidogrel (PLAVIX) 75 MG tablet, TAKE 1 TABLET(75 MG) BY MOUTH DAILY, Disp: 90 tablet, Rfl: 3   donepezil (ARICEPT) 5 MG tablet, TAKE 1 TABLET(5 MG) BY MOUTH AT BEDTIME (Patient taking differently: Take 5 mg by mouth at bedtime.), Disp: 90 tablet, Rfl: 3   latanoprost (XALATAN) 0.005 % ophthalmic solution,  Place 1 drop into both eyes at bedtime., Disp: , Rfl: 0   losartan (COZAAR) 100 MG tablet, Take 1 tablet (100 mg total) by mouth daily., Disp: 90 tablet, Rfl: 3   metFORMIN (GLUCOPHAGE) 500 MG tablet, TAKE 1 TABLET(500 MG) BY MOUTH TWICE DAILY, Disp: 180 tablet, Rfl: 3   metoprolol succinate (TOPROL-XL) 100 MG 24 hr tablet, TAKE 1 TABLET BY MOUTH EVERY DAY WITH OR IMMEDIATELY FOLLOWING A MEAL, Disp: 60 tablet, Rfl: 5   polyethylene glycol (MIRALAX / GLYCOLAX) 17 g packet, Take 17 g by mouth daily., Disp: , Rfl:    rosuvastatin (CRESTOR) 20 MG tablet, TAKE 1 TABLET(20 MG) BY MOUTH DAILY (Patient taking differently: Take 20 mg by mouth daily.), Disp: 90 tablet, Rfl: 3   Skin Protectants, Misc. (ENDIT EX), Apply 1 Application topically daily., Disp: , Rfl:   Review of Symptoms: Complete 10-system review is negative except as above in Interval History.  Physical Exam: BP 133/84 (BP Location: Left Arm, Patient Position: Sitting)   Pulse 84   Temp 99 F (37.2 C) (Oral)   Resp 14   Ht _0  (1.473 m)   Wt 102 lb (46.3 kg)   SpO2 99%   BMI 21.32 kg/m  General: ***Alert, oriented, no acute distress. HEENT: ***Normocephalic, atraumatic. Neck symmetric without masses. Sclera anicteric. ***Posterior oropharynx clear. Chest: ***Normal work of breathing. ***Clear to auscultation bilaterally.  ***Port site clean. Cardiovascular: ***Regular rate and rhythm, no murmurs. Abdomen: ***Soft, nontender.  Normoactive bowel sounds.  No masses or hepatosplenomegaly appreciated.  ***Well-healed scar. Extremities: ***Grossly normal range of motion.  Warm, well perfused.  No edema bilaterally. Skin: ***No rashes or lesions noted. Lymphatics: ***No cervical, supraclavicular, or inguinal adenopathy. GU: Normal appearing external genitalia without erythema, excoriation, or lesions.  Speculum exam reveals ***.  Bimanual exam reveals ***.  ***Rectovaginal exam ***. Exam chaperoned by ***  Laboratory & Radiologic  Studies: ***

## 2022-10-14 ENCOUNTER — Encounter: Payer: Self-pay | Admitting: Psychiatry

## 2022-10-24 ENCOUNTER — Telehealth: Payer: Self-pay

## 2022-10-24 NOTE — Telephone Encounter (Signed)
Per 12/15 IB, message has been left with pt daughter

## 2022-10-30 LAB — MISC LABCORP TEST (SEND OUT): Labcorp test code: 9985

## 2022-10-31 ENCOUNTER — Encounter: Payer: Self-pay | Admitting: Podiatry

## 2022-10-31 ENCOUNTER — Ambulatory Visit (INDEPENDENT_AMBULATORY_CARE_PROVIDER_SITE_OTHER): Payer: Medicare Other | Admitting: Podiatry

## 2022-10-31 DIAGNOSIS — E119 Type 2 diabetes mellitus without complications: Secondary | ICD-10-CM

## 2022-10-31 DIAGNOSIS — M79674 Pain in right toe(s): Secondary | ICD-10-CM

## 2022-10-31 DIAGNOSIS — M79675 Pain in left toe(s): Secondary | ICD-10-CM | POA: Diagnosis not present

## 2022-10-31 DIAGNOSIS — B351 Tinea unguium: Secondary | ICD-10-CM | POA: Diagnosis not present

## 2022-10-31 NOTE — Progress Notes (Signed)
This patient returns to my office for at risk foot care.  This patient requires this care by a professional since this patient will be at risk due to having diabetes and coagulation defect.  Patient is taking plavix.  This patient is unable to cut nails herself since the patient cannot reach hiernails.These nails are painful walking and wearing shoes.  This patient presents for at risk foot care today.  General Appearance  Alert, conversant and in no acute stress.  Vascular  Dorsalis pedis and posterior tibial  pulses are palpable  bilaterally.  Capillary return is within normal limits  bilaterally. Temperature is within normal limits  bilaterally.  Neurologic  Senn-Weinstein monofilament wire test within normal limits  bilaterally. Muscle power within normal limits bilaterally.  Nails Thick disfigured discolored nails with subungual debris  from hallux to fifth toes bilaterally. No evidence of bacterial infection or drainage bilaterally.  Orthopedic  No limitations of motion  feet .  No crepitus or effusions noted.  No bony pathology or digital deformities noted.  Skin  normotropic skin with no porokeratosis noted bilaterally.  No signs of infections or ulcers noted.     Onychomycosis  Pain in right toes  Pain in left toes  Consent was obtained for treatment procedures.   Mechanical debridement of nails 1-5  bilaterally performed with a nail nipper.  Filed with dremel without incident.    Return office visit   3  months                   Told patient to return for periodic foot care and evaluation due to potential at risk complications.   Gardiner Barefoot DPM

## 2022-11-12 ENCOUNTER — Other Ambulatory Visit: Payer: Self-pay

## 2022-11-12 ENCOUNTER — Inpatient Hospital Stay: Payer: Medicare Other | Attending: Psychiatry | Admitting: Nurse Practitioner

## 2022-11-12 ENCOUNTER — Encounter: Payer: Self-pay | Admitting: Nurse Practitioner

## 2022-11-12 VITALS — BP 130/78 | HR 78 | Temp 97.7°F | Resp 15 | Ht <= 58 in | Wt 105.5 lb

## 2022-11-12 DIAGNOSIS — R530 Neoplastic (malignant) related fatigue: Secondary | ICD-10-CM | POA: Diagnosis not present

## 2022-11-12 DIAGNOSIS — R197 Diarrhea, unspecified: Secondary | ICD-10-CM | POA: Diagnosis not present

## 2022-11-12 DIAGNOSIS — Z515 Encounter for palliative care: Secondary | ICD-10-CM | POA: Diagnosis not present

## 2022-11-12 DIAGNOSIS — Z7189 Other specified counseling: Secondary | ICD-10-CM

## 2022-11-12 NOTE — Progress Notes (Signed)
[facility]  Telephone:([phone] Fax:([phone]) [phone]   Name: Amber Chang Date: 11/12/2022 MRN: [id number]  DOB: 02-15-1927  Patient Care Team: Elby Showers, MD as PCP - General (Internal Medicine)    REASON FOR CONSULTATION: Amber Chang is a 89 y.o. female with oncologic medical history including stage I ovarian cancer, s/p therapeutic cystic aspiration, CVA, hyperlipidemia, hypertension, heart failure, memory loss.  Palliative ask to see for symptom management and goals of care.    SOCIAL HISTORY:     reports that she has never smoked. She has never used smokeless tobacco. She reports that she does not currently use alcohol. She reports that she does not use drugs.  ADVANCE DIRECTIVES:  Advanced directives on file. MOST form completed today.   CODE STATUS: DNR  PAST MEDICAL HISTORY: Past Medical History:  Diagnosis Date   Diabetes mellitus without complication ([place])    Glaucoma    Hyperlipidemia    Hypertension     PAST SURGICAL HISTORY:  Past Surgical History:  Procedure Laterality Date   ABDOMINAL HYSTERECTOMY     Fibroids in her 88s   CYST EXCISION      HEMATOLOGY/ONCOLOGY HISTORY:  Oncology History   No history exists.    ALLERGIES:  is allergic to penicillins.  MEDICATIONS:  Current Outpatient Medications  Medication Sig Dispense Refill   acetaminophen (TYLENOL) 500 MG tablet Take 500 mg by mouth every 6 (six) hours as needed for moderate pain.     albuterol (VENTOLIN HFA) 108 (90 Base) MCG/ACT inhaler INHALE 2 PUFFS INTO THE LUNGS EVERY 6 HOURS AS NEEDED FOR WHEEZING OR SHORTNESS OF BREATH 6.7 g PRN   amLODipine (NORVASC) 10 MG tablet TAKE 1 TABLET(10 MG) BY MOUTH DAILY (Patient taking differently: Take 10 mg by mouth daily.) 90 tablet 1   aspirin EC 81 MG tablet Take 81 mg by mouth daily. Swallow whole.     benzonatate (TESSALON) 100 MG capsule Take 1 capsule (100 mg total) by mouth 2 (two) times  daily as needed for cough. 60 capsule 5   Blood Glucose Monitoring Suppl (PRODIGY AUTOCODE BLOOD GLUCOSE) w/Device KIT Check glucose three times a day  Dispense lancets and test strips 1 kit 11   cetirizine (ZYRTEC) 10 MG tablet Take 10 mg by mouth daily.     clopidogrel (PLAVIX) 75 MG tablet TAKE 1 TABLET(75 MG) BY MOUTH DAILY 90 tablet 3   donepezil (ARICEPT) 5 MG tablet TAKE 1 TABLET(5 MG) BY MOUTH AT BEDTIME (Patient taking differently: Take 5 mg by mouth at bedtime.) 90 tablet 3   latanoprost (XALATAN) 0.005 % ophthalmic solution Place 1 drop into both eyes at bedtime.  0   losartan (COZAAR) 100 MG tablet Take 1 tablet (100 mg total) by mouth daily. 90 tablet 3   metFORMIN (GLUCOPHAGE) 500 MG tablet TAKE 1 TABLET(500 MG) BY MOUTH TWICE DAILY 180 tablet 3   metoprolol succinate (TOPROL-XL) 100 MG 24 hr tablet TAKE 1 TABLET BY MOUTH EVERY DAY WITH OR IMMEDIATELY FOLLOWING A MEAL 60 tablet 5   polyethylene glycol (MIRALAX / GLYCOLAX) 17 g packet Take 17 g by mouth daily.     rosuvastatin (CRESTOR) 20 MG tablet TAKE 1 TABLET(20 MG) BY MOUTH DAILY (Patient taking differently: Take 20 mg by mouth daily.) 90 tablet 3   Skin Protectants, Misc. (ENDIT EX) Apply 1 Application topically daily.     No current facility-administered medications for this visit.    VITAL SIGNS:  There were no vitals taken for this visit. There were no vitals filed for this visit.  Estimated body mass index is 21.32 kg/m as calculated from the following:   Height as of 10/13/22: _0  (1.473 m).   Weight as of 10/13/22: 102 lb (46.3 kg).  LABS: CBC:    Component Value Date/Time   WBC 7.0 10/06/2022 1113   HGB 11.3 (L) 10/06/2022 1113   HCT 35.4 (L) 10/06/2022 1113   PLT 299 10/06/2022 1113   MCV 95.4 10/06/2022 1113   NEUTROABS 4,071 03/27/2022 1244   LYMPHSABS 1,822 03/27/2022 1244   MONOABS 0.7 03/24/2022 0010   EOSABS 262 03/27/2022 1244   BASOSABS 41 03/27/2022 1244   Comprehensive Metabolic Panel:     Component Value Date/Time   NA 138 08/27/2022 1604   K 3.8 08/27/2022 1604   CL 107 08/27/2022 1604   CO2 22 08/27/2022 1604   BUN 18 08/27/2022 1604   CREATININE 1.10 (H) 08/27/2022 1604   CREATININE 0.81 03/27/2022 1243   GLUCOSE 123 (H) 08/27/2022 1604   CALCIUM 9.4 08/27/2022 1604   AST 22 08/27/2022 1604   ALT 15 08/27/2022 1604   ALKPHOS 58 08/27/2022 1604   BILITOT 0.7 08/27/2022 1604   PROT 6.6 08/27/2022 1604   ALBUMIN 3.7 08/27/2022 1604    RADIOGRAPHIC STUDIES: No results found.  PERFORMANCE STATUS (ECOG) : 1 - Symptomatic but completely ambulatory  Review of Systems  Constitutional:  Positive for fatigue.  Musculoskeletal:  Positive for gait problem.  Neurological:        Memory deficit in the setting of known dementia   Unless otherwise noted, a complete review of systems is negative.  Physical Exam General: NAD, sitting in wheelchair  Cardiovascular: regular rate and rhythm Pulmonary: clear ant fields Abdomen: soft, nontender, + bowel sounds Extremities: no edema, no joint deformities Skin: no rashes Neurological: alert and able to answer some questions appropriately   IMPRESSION: This is my initial visit with Amber Chang. She presents to the clinic today with her daughter, [name].  Patient is sitting comfortably in a wheelchair drinking Coke.  Alert and able to somewhat engage appropriately in discussions.  I introduced myself, [place] RN, and Palliative's role in collaboration with the oncology team. Concept of Palliative Care was introduced as specialized medical care for people and their families living with serious illness.  It focuses on providing relief from the symptoms and stress of a serious illness.  The goal is to improve quality of life for both the patient and the family. Values and goals of care important to patient and family were attempted to be elicited.   Amber Chang lives in the home with her daughter [name].  She is a retired Museum/gallery conservator.  4 children, 6 grandchildren, 2 great-grandchildren. Amber Chang has outlived all 80 of her siblings.   Patient requires assistance with ADLs in the home.  She is ambulatory with a walker.  Daughter endorses recent fall within the past 30 days during any attempt to go to the bathroom.  Patient does have known dementia with memory loss.  Her appetite is fair.  Daughter verbalizes patient requires frequent prompting and reminders.  Occasional incontinence.  We discussed patient's mobility and safety in the home.  Daughter would like to have a lightweight wheelchair and a bedside commode in the home.  Denies any nausea, vomiting, or constipation. Daughter does endorse loose stools. She is taking daily miralax. Reports some bowel incontinence due to urgency  and inability to ambulate to restroom in time. Recommended holding miralax for next 2 days and then restart only administering every other day. Daughter verbalized understanding.   We discussed her current illness and what it means in the larger context of Her on-going co-morbidities. Natural disease trajectory and expectations were discussed.  [name] is realistic in her understanding of her mother's condition. She expressed their focus is on her quality of life. She does not have symptom management needs at this time however she would not wish for patient to suffer.   Patient has a documented advanced directive on file.  Documents have been personally reviewed and discussed with daughter.  No changes needed at this time.  [name] is patient's medical decision-maker per documentation.  Confirms wishes for DNR/DNI.  Out of facility form provided as daughter states she does not have 1 in the home.  Education provided on MOLST form.  As requested completed today.  The patient and family outlined their wishes for the following treatment decisions:  Cardiopulmonary Resuscitation: Do Not Attempt Resuscitation (DNR/No CPR)  Medical  Interventions: Comfort Measures: Keep clean, warm, and dry. Use medication by any route, positioning, wound care, and other measures to relieve pain and suffering. Use oxygen, suction and manual treatment of airway obstruction as needed for comfort. Do not transfer to the hospital unless comfort needs cannot be met in current location.  Antibiotics: Determine use of limitation of antibiotics when infection occurs  IV Fluids: No IV fluids (provide other measures to ensure comfort)  Feeding Tube: No feeding tube    Education provide outpatient hospice's goals and philosophy of care as daughter is interested in information.  We also discussed residential hospice in the event patient becomes increasingly symptomatic or requires increasing need of care closer to end-of-life.  Daughter verbalized understanding and knows we can initiate services at any time by discussing further with the medical team/giving our office a call.  I discussed the importance of continued conversation with family and their medical providers regarding overall plan of care and treatment options, ensuring decisions are within the context of the patients values and GOCs.  PLAN: Established therapeutic relationship. Education provided on palliative's role in collaboration with their Oncology/Radiation team. Hold MiraLAX for the next 2 days.  Then restart administering every other day to prevent diarrhea. Home DME orders placed for bedside commode and likely transfer wheelchair. Extensive goals of care discussions with daughter.  [name] is clear in her expressed wishes to treat the treatable however with no aggressive interventions.  Their main focus is on patient's comfort and quality of life.  No current symptom management needs at this time.  Family is taking things one day at a time. DNR/DNI (out of facility form completed and original given to daughter) MOLST form completed (original form given to daughter) see above I will plan  to see patient back in 2-4 weeks in collaboration to other oncology appointments.  Daughter knows to contact office sooner if needed.   Patient expressed understanding and was in agreement with this plan. She also understands that She can call the clinic at any time with any questions, concerns, or complaints.   Thank you for your referral and allowing Palliative to assist in Amber Chang's care.   Number and complexity of problems addressed: HIGH - 1 or more chronic illnesses with SEVERE exacerbation, progression, or side effects of treatment - advanced cancer, pain. Any controlled substances utilized were prescribed in the context of palliative care.  Time  Total: 50 min   Visit consisted of counseling and education dealing with the complex and emotionally intense issues of symptom management and palliative care in the setting of serious and potentially life-threatening illness.Greater than 50%  of this time was spent counseling and coordinating care related to the above assessment and plan.  Signed by: Alda Lea, AGPCNP-BC Palliative Medicine Team/ [place]

## 2022-11-21 ENCOUNTER — Other Ambulatory Visit: Payer: Self-pay | Admitting: Internal Medicine

## 2022-11-24 ENCOUNTER — Other Ambulatory Visit: Payer: Medicare Other

## 2022-11-24 DIAGNOSIS — E119 Type 2 diabetes mellitus without complications: Secondary | ICD-10-CM

## 2022-11-24 DIAGNOSIS — E782 Mixed hyperlipidemia: Secondary | ICD-10-CM

## 2022-11-25 ENCOUNTER — Ambulatory Visit (INDEPENDENT_AMBULATORY_CARE_PROVIDER_SITE_OTHER): Payer: Medicare Other | Admitting: Internal Medicine

## 2022-11-25 ENCOUNTER — Encounter: Payer: Self-pay | Admitting: Internal Medicine

## 2022-11-25 ENCOUNTER — Other Ambulatory Visit: Payer: Self-pay

## 2022-11-25 VITALS — BP 132/66 | HR 61 | Temp 98.2°F | Ht <= 58 in | Wt 100.4 lb

## 2022-11-25 DIAGNOSIS — I10 Essential (primary) hypertension: Secondary | ICD-10-CM

## 2022-11-25 DIAGNOSIS — J22 Unspecified acute lower respiratory infection: Secondary | ICD-10-CM

## 2022-11-25 DIAGNOSIS — C569 Malignant neoplasm of unspecified ovary: Secondary | ICD-10-CM

## 2022-11-25 DIAGNOSIS — E785 Hyperlipidemia, unspecified: Secondary | ICD-10-CM

## 2022-11-25 DIAGNOSIS — G3184 Mild cognitive impairment, so stated: Secondary | ICD-10-CM

## 2022-11-25 DIAGNOSIS — E1169 Type 2 diabetes mellitus with other specified complication: Secondary | ICD-10-CM

## 2022-11-25 DIAGNOSIS — R413 Other amnesia: Secondary | ICD-10-CM

## 2022-11-25 DIAGNOSIS — Z8673 Personal history of transient ischemic attack (TIA), and cerebral infarction without residual deficits: Secondary | ICD-10-CM

## 2022-11-25 LAB — LIPID PANEL
Cholesterol: 126 mg/dL (ref <200)
HDL: 54 mg/dL (ref >=50)
LDL Cholesterol (Calc): 57 mg/dL (calc)
Non-HDL Cholesterol (Calc): 72 mg/dL (calc) (ref <130)
Total CHOL/HDL Ratio: 2.3 (calc) (ref <5.0)
Triglycerides: 74 mg/dL (ref <150)

## 2022-11-25 LAB — HEPATIC FUNCTION PANEL
AG Ratio: 1.3 (calc) (ref 1.0–2.5)
ALT: 9 U/L (ref 6–29)
AST: 13 U/L (ref 10–35)
Albumin: 3.9 g/dL (ref 3.6–5.1)
Alkaline phosphatase (APISO): 69 U/L (ref 37–153)
Bilirubin, Direct: 0.2 mg/dL (ref 0.0–0.2)
Globulin: 2.9 g/dL (calc) (ref 1.9–3.7)
Indirect Bilirubin: 0.5 mg/dL (calc) (ref 0.2–1.2)
Total Bilirubin: 0.7 mg/dL (ref 0.2–1.2)
Total Protein: 6.8 g/dL (ref 6.1–8.1)

## 2022-11-25 LAB — HEMOGLOBIN A1C
Hgb A1c MFr Bld: 5.2 % of total Hgb (ref <5.7)
Mean Plasma Glucose: 103 mg/dL
eAG (mmol/L): 5.7 mmol/L

## 2022-11-25 MED ORDER — AZITHROMYCIN 250 MG PO TABS: ORAL_TABLET | ORAL | 0 refills | Status: AC

## 2022-11-25 MED ORDER — ALBUTEROL SULFATE HFA 108 (90 BASE) MCG/ACT IN AERS: 2.0000 | INHALATION_SPRAY | Freq: Four times a day (QID) | RESPIRATORY_TRACT | 99 refills | Status: DC | PRN

## 2022-11-25 NOTE — Patient Instructions (Addendum)
For respiratory infection, take Zithromax tab 2 tabs on day 1 followed by one tab days 2-5.  May take to [facility] as needed for cough.  Labs are within normal limits.  Continue follow-up with GYN in oncologist.  Next visit due here July 2024 for Medicare wellness and health maintenance exam.  No change in medications.  Rest and stay well-hydrated.  Call if symptoms worsen or do not improve within a few days.

## 2022-11-25 NOTE — Progress Notes (Signed)
   Subjective:    Patient ID: Amber Chang, female    DOB: 1926/12/16, 89 y.o.   MRN: [id number]  HPI 89 year old Female seen for follow up. Dx with Stage 1 Ovarian Cancer. Hx constipation treated with Miralax.  She is accompanied by her daughter.  Has follow-up with Oncology in March.  No complaint of pain.  She has a history of type 2 diabetes mellitus, hyperlipidemia and hypertension.  Continue with rosuvastatin, metformin, losartan amlodipine and albuterol inhaler.  Hgb AIC, lipid and liver functions are all normal.  She has developed acute lower respiratory infection symptoms with cough.  No fever.  No chills.  Symptoms started after going to the drugstore to get an immunization last week.  She has mild cognitive impairment which is stable.  History of glaucoma treated with Xalatan.  There is history of stroke in the remote past but is not clear from her old records when that occurred.  She has been maintained on Plavix and aspirin.  She moved here in January 2017 from [place], [place] to be with her daughter.  Review of Systems no fever or chills.     Objective:   Physical Exam   BP 132/66 pulse 61 T 98.2 degrees pulse ox 94% weight 100 lb 6.4 oz. BMI 20.98.  In November she weighed 106 pounds.  In July she weighed 119 pounds.  Appetite is decreased according to her daughter. She is alert and cooperative.  Skin warm and dry.  Chest clear to auscultation but she has a deep congested cough.  No rales are appreciated today.  Pharynx is slightly injected.  Neck supple.  Cardiac exam: Regular rate and rhythm.  No lower extremity edema.  She is alert.     Assessment & Plan:  History of ovarian manage stage I ovarian cancer followed by GYN oncology  History of impaired glucose tolerance-stable with normal hemoglobin A1c on metformin  Essential hypertension treated with multiple medications and stable  Memory loss treated with  Aricept  Glaucoma treated with Xalatan  Hyperlipidemia treated with generic Crestor  Remote history of CVA treated with aspirin  Acute lower respiratory infection -treat with Zithromax Z-PAK 2 tabs day 1 followed by 1 tab days 2 through 5.  Plan: Will schedule Medicare wellness visit and health maintenance exam for July.

## 2022-12-03 ENCOUNTER — Inpatient Hospital Stay (HOSPITAL_BASED_OUTPATIENT_CLINIC_OR_DEPARTMENT_OTHER): Payer: Medicare Other | Admitting: Nurse Practitioner

## 2022-12-03 DIAGNOSIS — Z515 Encounter for palliative care: Secondary | ICD-10-CM

## 2022-12-03 DIAGNOSIS — R630 Anorexia: Secondary | ICD-10-CM

## 2022-12-03 DIAGNOSIS — R530 Neoplastic (malignant) related fatigue: Secondary | ICD-10-CM

## 2022-12-03 DIAGNOSIS — Z7189 Other specified counseling: Secondary | ICD-10-CM

## 2022-12-03 NOTE — Progress Notes (Signed)
[facility]  Telephone:(336) [phone] Fax:(336) [phone]   Name: Amber Chang Date: 12/03/2022 MRN: [id number]  DOB: 14-Nov-1926  Patient Care Team: Elby Showers, MD as PCP - General (Internal Medicine)    I connected with Amber Chang on 12/03/22 at 10:30 AM EST by Phone and verified that I am speaking with the correct person using two identifiers.   I discussed the limitations, risks, security and privacy concerns of performing an evaluation and management service by telemedicine and the availability of in-person appointments. I also discussed with the patient that there may be a patient responsible charge related to this service. The patient expressed understanding and agreed to proceed.   Other persons participating in the visit and their role in the encounter: Maygan, RN and Amber Chang (patient's daughter)   Patient's location: home  Provider's location: [facility]   Chief Complaint: follow up of symptom management     INTERVAL HISTORY: Amber Chang is a 89 y.o. female with oncologic medical history including stage I ovarian cancer, s/p therapeutic cystic aspiration, CVA, hyperlipidemia, hypertension, heart failure, memory loss.  Palliative ask to see for symptom management and goals of care.   SOCIAL HISTORY:     reports that she has never smoked. She has never used smokeless tobacco. She reports that she does not currently use alcohol. She reports that she does not use drugs.  ADVANCE DIRECTIVES:  MOST, DNR, and Advanced Directives on file  CODE STATUS: DNR  PAST MEDICAL HISTORY: Past Medical History:  Diagnosis Date   Diabetes mellitus without complication ([place])    Glaucoma    Hyperlipidemia    Hypertension     ALLERGIES:  is allergic to penicillins.  MEDICATIONS:  Current Outpatient Medications  Medication Sig Dispense Refill   acetaminophen (TYLENOL) 500 MG tablet Take 500 mg by mouth every 6 (six) hours as needed  for moderate pain.     albuterol (VENTOLIN HFA) 108 (90 Base) MCG/ACT inhaler Inhale 2 puffs into the lungs every 6 (six) hours as needed for wheezing or shortness of breath. 6.7 g PRN   amLODipine (NORVASC) 10 MG tablet TAKE 1 TABLET(10 MG) BY MOUTH DAILY (Patient taking differently: Take 10 mg by mouth daily.) 90 tablet 1   aspirin EC 81 MG tablet Take 81 mg by mouth daily. Swallow whole.     benzonatate (TESSALON) 100 MG capsule TAKE 1 CAPSULE(100 MG) BY MOUTH TWICE DAILY AS NEEDED FOR COUGH 60 capsule 5   Blood Glucose Monitoring Suppl (PRODIGY AUTOCODE BLOOD GLUCOSE) w/Device KIT Check glucose three times a day  Dispense lancets and test strips (Patient not taking: Reported on 11/25/2022) 1 kit 11   cetirizine (ZYRTEC) 10 MG tablet Take 10 mg by mouth daily.     clopidogrel (PLAVIX) 75 MG tablet TAKE 1 TABLET(75 MG) BY MOUTH DAILY 90 tablet 3   donepezil (ARICEPT) 5 MG tablet TAKE 1 TABLET(5 MG) BY MOUTH AT BEDTIME (Patient taking differently: Take 5 mg by mouth at bedtime.) 90 tablet 3   latanoprost (XALATAN) 0.005 % ophthalmic solution Place 1 drop into both eyes at bedtime.  0   losartan (COZAAR) 100 MG tablet Take 1 tablet (100 mg total) by mouth daily. 90 tablet 3   metFORMIN (GLUCOPHAGE) 500 MG tablet TAKE 1 TABLET(500 MG) BY MOUTH TWICE DAILY 180 tablet 3   metoprolol succinate (TOPROL-XL) 100 MG 24 hr tablet TAKE 1 TABLET BY MOUTH EVERY DAY WITH OR IMMEDIATELY FOLLOWING A MEAL  60 tablet 5   polyethylene glycol (MIRALAX / GLYCOLAX) 17 g packet Take 17 g by mouth daily.     rosuvastatin (CRESTOR) 20 MG tablet TAKE 1 TABLET(20 MG) BY MOUTH DAILY (Patient taking differently: Take 20 mg by mouth daily.) 90 tablet 3   Skin Protectants, Misc. (ENDIT EX) Apply 1 Application topically daily.     No current facility-administered medications for this visit.    VITAL SIGNS: There were no vitals taken for this visit. There were no vitals filed for this visit.  Estimated body mass index is  20.98 kg/m as calculated from the following:   Height as of 11/25/22: '4\' 10"'$  (1.473 m).   Weight as of 11/25/22: 100 lb 6.4 oz (45.5 kg).   PERFORMANCE STATUS (ECOG) : 1 - Symptomatic but completely ambulatory   IMPRESSION: I connected by phone with patient and her daughter, Amber Chang. No acute distress. Daughter reports patient is doing as well as expected. They received bedside commode and wheelchair which they are appreciative of.   Appetite is fair. Some days are better than others. Denies nausea, vomiting, constipation, or diarrhea. Patient does endorse some fatigue. Daughter shares patient does nap frequently during the day. Sleeps well at night.   Ms. Amber Chang and her family continues to take things one day at a time.   I discussed the importance of continued conversation with family and their medical providers regarding overall plan of care and treatment options, ensuring decisions are within the context of the patients values and GOCs.  PLAN:  Goals remain clear, patient and daughter wishes to treat the treatable however with no aggressive interventions.  Their main focus is on patient's comfort and quality of life.  No current symptom management needs at this time.  Family is taking things one day at a time. DNR/DNI and MOST form were completed on 11/12/22.  Ongoing support as needed.  I will plan to see patient back in 4-6 weeks in collaboration to other oncology appointments.  Daughter knows to contact office sooner if needed.   Patient expressed understanding and was in agreement with this plan. She also understands that She can call the clinic at any time with any questions, concerns, or complaints.   Any controlled substances utilized were prescribed in the context of palliative care. PDMP has been reviewed.   Time Total: 30 min   Visit consisted of counseling and education dealing with the complex and emotionally intense issues of symptom management and palliative care in the  setting of serious and potentially life-threatening illness.Greater than 50%  of this time was spent counseling and coordinating care related to the above assessment and plan.  Amber Chang, AGPCNP-BC  Palliative Medicine Team/ [place]

## 2023-01-13 ENCOUNTER — Other Ambulatory Visit: Payer: Self-pay | Admitting: Internal Medicine

## 2023-01-16 NOTE — Progress Notes (Unsigned)
[facility]  Telephone:(336) [phone] Fax:(336) [phone]   Name: Amber Chang Date: 01/16/2023 MRN: [id number]  DOB: [birth year]  Patient Care Team: Elby Showers, MD as PCP - General (Internal Medicine)  INTERVAL HISTORY: Amber Chang is a 89 y.o. female with oncologic medical history including stage I ovarian cancer, s/p therapeutic cystic aspiration, CVA, hyperlipidemia, hypertension, heart failure, memory loss.  Palliative ask to see for symptom management and goals of care.   SOCIAL HISTORY:     reports that she has never smoked. She has never used smokeless tobacco. She reports that she does not currently use alcohol. She reports that she does not use drugs.  ADVANCE DIRECTIVES:  MOST, DNR, and Advanced Directives on file  CODE STATUS: DNR  PAST MEDICAL HISTORY: Past Medical History:  Diagnosis Date   Diabetes mellitus without complication ([place])    Glaucoma    Hyperlipidemia    Hypertension     ALLERGIES:  is allergic to penicillins.  MEDICATIONS:  Current Outpatient Medications  Medication Sig Dispense Refill   acetaminophen (TYLENOL) 500 MG tablet Take 500 mg by mouth every 6 (six) hours as needed for moderate pain.     albuterol (VENTOLIN HFA) 108 (90 Base) MCG/ACT inhaler Inhale 2 puffs into the lungs every 6 (six) hours as needed for wheezing or shortness of breath. 6.7 g PRN   amLODipine (NORVASC) 10 MG tablet TAKE 1 TABLET(10 MG) BY MOUTH DAILY 90 tablet 1   aspirin EC 81 MG tablet Take 81 mg by mouth daily. Swallow whole.     benzonatate (TESSALON) 100 MG capsule TAKE 1 CAPSULE(100 MG) BY MOUTH TWICE DAILY AS NEEDED FOR COUGH 60 capsule 5   cetirizine (ZYRTEC) 10 MG tablet Take 10 mg by mouth daily.     clopidogrel (PLAVIX) 75 MG tablet TAKE 1 TABLET(75 MG) BY MOUTH DAILY 90 tablet 3   donepezil (ARICEPT) 5 MG tablet TAKE 1 TABLET(5 MG) BY MOUTH AT BEDTIME (Patient taking differently: Take 5 mg by mouth at  bedtime.) 90 tablet 3   latanoprost (XALATAN) 0.005 % ophthalmic solution Place 1 drop into both eyes at bedtime.  0   losartan (COZAAR) 100 MG tablet Take 1 tablet (100 mg total) by mouth daily. 90 tablet 3   metFORMIN (GLUCOPHAGE) 500 MG tablet TAKE 1 TABLET(500 MG) BY MOUTH TWICE DAILY 180 tablet 3   metoprolol succinate (TOPROL-XL) 100 MG 24 hr tablet TAKE 1 TABLET BY MOUTH EVERY DAY WITH OR IMMEDIATELY FOLLOWING A MEAL 60 tablet 5   polyethylene glycol (MIRALAX / GLYCOLAX) 17 g packet Take 17 g by mouth daily.     rosuvastatin (CRESTOR) 20 MG tablet TAKE 1 TABLET(20 MG) BY MOUTH DAILY (Patient taking differently: Take 20 mg by mouth daily.) 90 tablet 3   Skin Protectants, Misc. (ENDIT EX) Apply 1 Application topically daily.     No current facility-administered medications for this visit.    VITAL SIGNS: There were no vitals taken for this visit. There were no vitals filed for this visit.  Estimated body mass index is 20.98 kg/m as calculated from the following:   Height as of 11/25/22: '4\' 10"'$  (1.473 m).   Weight as of 11/25/22: 100 lb 6.4 oz (45.5 kg).   PERFORMANCE STATUS (ECOG) : 1 - Symptomatic but completely ambulatory  Assessment NAD, in wheelchair RRR Clear bilaterally Soft, nontender AAOx4  IMPRESSION: Ms. Kohut presents to clinic today for follow-up.  Her daughter is present.  No acute distress noted.  Patient is sitting upright in wheelchair.  Is able to engage in discussions throughout visit.  Denies nausea, vomiting, constipation, or diarrhea.  Daughter states patient continues to have ongoing fatigue.  Reports she takes several naps throughout the day and sleeps throughout the night.  No recent falls or changes in behavior.  Patient's appetite is fair.  Some days are better than others.  Does endorse occasional leg pain however this is managed with Tylenol as needed.  Daughter shares concerns that patient sometimes shares that it is harder for her to swallow her  pills or feels like they are getting hung in her throat.  Denies any choking or complications when eating.  Education provided on drinking something prior to taking medications to moisturize esophagus and mouth.  Also discussed taking medications with pudding or applesauce to allow for additional consistency while also using chin tuck motion.  Patient and daughter verbalized understanding and appreciation.  Patient states she loves eating pudding.  No symptom management needs at this time.  Family is aware we are available for ongoing support as needed.  I discussed the importance of continued conversation with family and their medical providers regarding overall plan of care and treatment options, ensuring decisions are within the context of the patients values and GOCs.  PLAN:  Goals remain clear, patient and daughter wishes to treat the treatable however with no aggressive interventions.  Their main focus is on patient's comfort and quality of life.  No current symptom management needs at this time.  Family is taking things one day at a time. DNR/DNI and MOST form were completed on 11/12/22.  Ongoing support as needed.  Education provided on taking medication with pudding or applesauce to allow for better consistency as she complains of some difficulty at times with taking her pills.  Also encouraged her to drink prior to taking medication to moisturize mouth and esophagus.  Utilize chin tuck.  Daughter aware to contact office if she notices any further difficulty with eating or swallowing including coughing or choking with meals. I will plan to see patient back in 4-6 weeks in collaboration to other oncology appointments.  Daughter knows to contact office sooner if needed.   Patient expressed understanding and was in agreement with this plan. She also understands that She can call the clinic at any time with any questions, concerns, or complaints.   Any controlled substances utilized were  prescribed in the context of palliative care. PDMP has been reviewed.    Time Total: 45 min   Visit consisted of counseling and education dealing with the complex and emotionally intense issues of symptom management and palliative care in the setting of serious and potentially life-threatening illness.Greater than 50%  of this time was spent counseling and coordinating care related to the above assessment and plan.  Alda Lea, AGPCNP-BC  Palliative Medicine Team/[place]

## 2023-01-19 ENCOUNTER — Other Ambulatory Visit: Payer: Self-pay

## 2023-01-19 ENCOUNTER — Encounter: Payer: Self-pay | Admitting: Nurse Practitioner

## 2023-01-19 ENCOUNTER — Inpatient Hospital Stay (HOSPITAL_BASED_OUTPATIENT_CLINIC_OR_DEPARTMENT_OTHER): Payer: Medicare Other | Admitting: Nurse Practitioner

## 2023-01-19 ENCOUNTER — Inpatient Hospital Stay: Payer: Medicare Other | Attending: Psychiatry | Admitting: Psychiatry

## 2023-01-19 VITALS — BP 137/76 | HR 83 | Temp 97.8°F | Resp 14 | Wt 100.4 lb

## 2023-01-19 DIAGNOSIS — Z66 Do not resuscitate: Secondary | ICD-10-CM | POA: Diagnosis not present

## 2023-01-19 DIAGNOSIS — R410 Disorientation, unspecified: Secondary | ICD-10-CM | POA: Diagnosis not present

## 2023-01-19 DIAGNOSIS — R5383 Other fatigue: Secondary | ICD-10-CM | POA: Diagnosis not present

## 2023-01-19 DIAGNOSIS — C569 Malignant neoplasm of unspecified ovary: Secondary | ICD-10-CM

## 2023-01-19 DIAGNOSIS — Z515 Encounter for palliative care: Secondary | ICD-10-CM

## 2023-01-19 DIAGNOSIS — R630 Anorexia: Secondary | ICD-10-CM | POA: Diagnosis not present

## 2023-01-19 DIAGNOSIS — R32 Unspecified urinary incontinence: Secondary | ICD-10-CM | POA: Diagnosis not present

## 2023-01-19 DIAGNOSIS — R530 Neoplastic (malignant) related fatigue: Secondary | ICD-10-CM | POA: Diagnosis not present

## 2023-01-19 NOTE — Progress Notes (Unsigned)
Gynecologic Oncology Return Clinic Visit  Date of Service: 01/19/2023 Referring Provider: Elby Showers, MD [address] [place],  [place] [id number]  Assessment & Plan: Amber Chang is a 89 y.o. woman with clinical Stage I ovarian cancer, s/p therapeutic cystic aspiration who presents for follow-up.  Cystic ovarian mass, clinical stage I ovarian cancer: - Reviewed cytology results that demonstrated malignant cells.  No cellblock to be obtained so no further stains or characterization can be made. - Reviewed in brief again the risk/benefits of cancer directed treatment versus symptom management. - Patient and daughter expressed desire to continue with symptom management and feel that this is very reasonable. - Patient had significant benefit from cyst drainage.  Reviewed signs and symptoms that could suggest reaccumulation of fluid. - If reaccumulation of fluid, could consider if VIR has additional procedural options that may benefit the patient for symptom management. - Otherwise patient's daughter reports that they brought in healthcare power of attorney and advanced directive paperwork sometime in the middle of November.  We will see if we can find this paperwork to make sure that it is available in the chart for the future.  Otherwise patient's daughter reports that she is the designated healthcare power of attorney and that the patient wishes to be DNR/DNI.  This was confirmed today. - Offered referral to palliative care for ongoing support and symptom management and potential coordination of transition to hospice when relevant.  Reviewed hospice care in general terms with patient and her daughter today. - We will continue to have patient follow-up to ensure that her symptoms are being addressed as they arise.    Difficulty swallowing pills   RTC 3 months.  Bernadene Bell, MD Gynecologic Oncology   Medical Decision Making I personally spent  TOTAL 35 minutes face-to-face  and non-face-to-face in the care of this patient, which includes all pre, intra, and post visit time on the date of service.  2 minutes spent reviewing records prior to the visit 30 Minutes in patient contact 3 minutes charting , conferring with consultants etc.   ----------------------- Reason for Visit: Follow-up  Oncologic History: 08/27/22: ED for concern for rectal bleeding/near syncope. CT with 18.6cm pelvic mass 09/15/22: Initial consult. Decision to proceed with conservative management with IR drainage of mass given pt's age and goals of care 10/06/22: IR drainage of 1.8L of cyst fluid, malignant cells noted   Interval History: Pt presents with her daughter today. She and her daughter report that she continues to do well overall. She is not having any pain. Having regular bowel movements. Taking a 1/2 capful of miralax most days. Has occasional "blow outs" and miralax is held. Uses tylenol PRN for occasional leg pain. Other stable fatigue, incontinence, confusion.    Past Medical/Surgical History: Past Medical History:  Diagnosis Date   Diabetes mellitus without complication ([place])    Glaucoma    Hyperlipidemia    Hypertension     Past Surgical History:  Procedure Laterality Date   ABDOMINAL HYSTERECTOMY     Fibroids in her 62s   CYST EXCISION      Family History  Problem Relation Age of Onset   Colon cancer Brother    Breast cancer Neg Hx    Ovarian cancer Neg Hx    Endometrial cancer Neg Hx    Stomach cancer Neg Hx    Esophageal cancer Neg Hx     Social History   Socioeconomic History   Marital status: Widowed    Spouse  name: Not on file   Number of children: Not on file   Years of education: Not on file   Highest education level: Not on file  Occupational History   Not on file  Tobacco Use   Smoking status: Never   Smokeless tobacco: Never  Vaping Use   Vaping Use: Never used  Substance and Sexual Activity   Alcohol use: Not Currently   Drug  use: No   Sexual activity: Not Currently  Other Topics Concern   Not on file  Social History Narrative   Not on file   Social Determinants of Health   Financial Resource Strain: Not on file  Food Insecurity: Not on file  Transportation Needs: Not on file  Physical Activity: Not on file  Stress: Not on file  Social Connections: Not on file    Current Medications:  Current Outpatient Medications:    acetaminophen (TYLENOL) 500 MG tablet, Take 500 mg by mouth every 6 (six) hours as needed for moderate pain., Disp: , Rfl:    albuterol (VENTOLIN HFA) 108 (90 Base) MCG/ACT inhaler, Inhale 2 puffs into the lungs every 6 (six) hours as needed for wheezing or shortness of breath., Disp: 6.7 g, Rfl: PRN   amLODipine (NORVASC) 10 MG tablet, TAKE 1 TABLET(10 MG) BY MOUTH DAILY, Disp: 90 tablet, Rfl: 1   aspirin EC 81 MG tablet, Take 81 mg by mouth daily. Swallow whole., Disp: , Rfl:    benzonatate (TESSALON) 100 MG capsule, TAKE 1 CAPSULE(100 MG) BY MOUTH TWICE DAILY AS NEEDED FOR COUGH, Disp: 60 capsule, Rfl: 5   cetirizine (ZYRTEC) 10 MG tablet, Take 10 mg by mouth daily., Disp: , Rfl:    clopidogrel (PLAVIX) 75 MG tablet, TAKE 1 TABLET(75 MG) BY MOUTH DAILY, Disp: 90 tablet, Rfl: 3   donepezil (ARICEPT) 5 MG tablet, TAKE 1 TABLET(5 MG) BY MOUTH AT BEDTIME (Patient taking differently: Take 5 mg by mouth at bedtime.), Disp: 90 tablet, Rfl: 3   latanoprost (XALATAN) 0.005 % ophthalmic solution, Place 1 drop into both eyes at bedtime., Disp: , Rfl: 0   losartan (COZAAR) 100 MG tablet, Take 1 tablet (100 mg total) by mouth daily., Disp: 90 tablet, Rfl: 3   metFORMIN (GLUCOPHAGE) 500 MG tablet, TAKE 1 TABLET(500 MG) BY MOUTH TWICE DAILY, Disp: 180 tablet, Rfl: 3   metoprolol succinate (TOPROL-XL) 100 MG 24 hr tablet, TAKE 1 TABLET BY MOUTH EVERY DAY WITH OR IMMEDIATELY FOLLOWING A MEAL, Disp: 60 tablet, Rfl: 5   polyethylene glycol (MIRALAX / GLYCOLAX) 17 g packet, Take 17 g by mouth daily., Disp: ,  Rfl:    rosuvastatin (CRESTOR) 20 MG tablet, TAKE 1 TABLET(20 MG) BY MOUTH DAILY (Patient taking differently: Take 20 mg by mouth daily.), Disp: 90 tablet, Rfl: 3   Skin Protectants, Misc. (ENDIT EX), Apply 1 Application topically daily., Disp: , Rfl:   Review of Symptoms: Complete 10-system review is negative except as above in Interval History.  Physical Exam: There were no vitals taken for this visit. General: Alert, oriented, no acute distress.  Sitting comfortably in wheelchair  ***some distension   Laboratory & Radiologic Studies: Cytology (10/06/22): FINAL MICROSCOPIC DIAGNOSIS:  - Malignant cells present  Cell Block: Cell block attempted, but not obtained.

## 2023-01-19 NOTE — Patient Instructions (Signed)
It was a pleasure to see you in clinic today. - Return visit planned for 3 months, but if new/worsening abdominal pain, worsening constipation that isn't treated with what you are doing at home, let us know so we can see if we need to see you sooner.  Thank you very much for allowing me to provide care for you today.  I appreciate your confidence in choosing our Gynecologic Oncology team at [facility].  If you have any questions about your visit today please call our office or send [place] a MyChart message and we will get back to you as soon as possible.

## 2023-01-21 ENCOUNTER — Encounter: Payer: Self-pay | Admitting: Psychiatry

## 2023-02-02 ENCOUNTER — Encounter: Payer: Self-pay | Admitting: Podiatry

## 2023-02-02 ENCOUNTER — Ambulatory Visit (INDEPENDENT_AMBULATORY_CARE_PROVIDER_SITE_OTHER): Payer: Medicare Other | Admitting: Podiatry

## 2023-02-02 DIAGNOSIS — B351 Tinea unguium: Secondary | ICD-10-CM

## 2023-02-02 DIAGNOSIS — M79675 Pain in left toe(s): Secondary | ICD-10-CM | POA: Diagnosis not present

## 2023-02-02 DIAGNOSIS — M79674 Pain in right toe(s): Secondary | ICD-10-CM

## 2023-02-02 DIAGNOSIS — E119 Type 2 diabetes mellitus without complications: Secondary | ICD-10-CM

## 2023-02-02 NOTE — Progress Notes (Signed)
This patient returns to my office for at risk foot care.  This patient requires this care by a professional since this patient will be at risk due to having diabetes and coagulation defect.  Patient is taking plavix.  This patient is unable to cut nails herself since the patient cannot reach hiernails.These nails are painful walking and wearing shoes.  This patient presents for at risk foot care today.  General Appearance  Alert, conversant and in no acute stress.  Vascular  Dorsalis pedis and posterior tibial  pulses are palpable  bilaterally.  Capillary return is within normal limits  bilaterally. Temperature is within normal limits  bilaterally.  Neurologic  Senn-Weinstein monofilament wire test within normal limits  bilaterally. Muscle power within normal limits bilaterally.  Nails Thick disfigured discolored nails with subungual debris  from hallux to fifth toes bilaterally. No evidence of bacterial infection or drainage bilaterally.  Orthopedic  No limitations of motion  feet .  No crepitus or effusions noted.  No bony pathology or digital deformities noted.  Skin  normotropic skin with no porokeratosis noted bilaterally.  No signs of infections or ulcers noted.     Onychomycosis  Pain in right toes  Pain in left toes  Consent was obtained for treatment procedures.   Mechanical debridement of nails 1-5  bilaterally performed with a nail nipper.  Filed with dremel without incident.    Return office visit   3  months                   Told patient to return for periodic foot care and evaluation due to potential at risk complications.   Airlie Blumenberg DPM   

## 2023-03-02 ENCOUNTER — Telehealth (INDEPENDENT_AMBULATORY_CARE_PROVIDER_SITE_OTHER): Payer: Medicare Other | Admitting: Internal Medicine

## 2023-03-02 ENCOUNTER — Telehealth: Payer: Self-pay | Admitting: Internal Medicine

## 2023-03-02 ENCOUNTER — Encounter: Payer: Self-pay | Admitting: Internal Medicine

## 2023-03-02 VITALS — BP 126/71 | HR 80 | Temp 97.3°F | Wt 100.0 lb

## 2023-03-02 DIAGNOSIS — J22 Unspecified acute lower respiratory infection: Secondary | ICD-10-CM | POA: Diagnosis not present

## 2023-03-02 MED ORDER — AZITHROMYCIN 200 MG/5ML PO SUSR: ORAL | 0 refills | Status: DC

## 2023-03-02 NOTE — Progress Notes (Signed)
Patient Care Team: Margaree Mackintosh, MD as PCP - General (Internal Medicine)  I connected with [place] Bells on 03/02/23 at 2:34 PM by video enabled telemedicine visit and verified that I am speaking with the correct person using two identifiers.   I discussed the limitations, risks, security and privacy concerns of performing an evaluation and management service by telemedicine and the availability of in-person appointments. I also discussed with the patient that there may be a patient responsible charge related to this service. The patient expressed understanding and agreed to proceed.   Other persons participating in the visit and their role in the encounter: Medical scribe, Doylene Bode  Patient's location: Home  Provider's location: Clinic   I provided 20 minutes of video time during this encounter, and > 50% was spent counseling as documented under my assessment & plan. She is identified using two identifiers as Amber Chang, a patient of this practice. She is in her house and I am in my office. She is agreeable to using this format today. Her daughter, [name] is with her during the visit.  Chief Complaint: cough, rhinorrhea   Subjective:    Patient ID: Amber Chang , Female    DOB: August 05, 1927, 89 y.o.    MRN: [id number]   89 y.o. Female presents today for cough, rhinorrhea since 03/01/23. Taking [employer]. Appetite is fair. She is hydrating adequately and eating well. She is having loose stools but also takes Miralax. Her daughter reports that her dementia symptoms have worsened and she doesn't talk frequently anymore. History of stage 1 ovarian cancer. Seen at [facility]. She visits palliative care once monthly. Family are considering in-home hospice care.   Past Medical History:  Diagnosis Date   Diabetes mellitus without complication    Glaucoma    Hyperlipidemia    Hypertension      Family History  Problem Relation Age of Onset   Colon  cancer Brother    Breast cancer Neg Hx    Ovarian cancer Neg Hx    Endometrial cancer Neg Hx    Stomach cancer Neg Hx    Esophageal cancer Neg Hx     Social History   Social History Narrative   Not on file      Review of Systems  Constitutional:  Positive for malaise/fatigue. Negative for fever.  HENT:  Negative for congestion.        (+) Rhinorrhea  Eyes:  Negative for blurred vision.  Respiratory:  Positive for cough. Negative for shortness of breath.   Cardiovascular:  Negative for chest pain, palpitations and leg swelling.  Gastrointestinal:  Negative for vomiting.  Musculoskeletal:  Negative for back pain.  Skin:  Negative for rash.  Neurological:  Negative for loss of consciousness and headaches.        Objective:   Vitals: BP 126/71   Pulse 80   Temp (!) 97.3 F (36.3 C) (Oral)   Wt 100 lb (45.4 kg)   SpO2 95%   BMI 20.90 kg/m    Physical Exam Vitals and nursing note reviewed.  Constitutional:      General: She is not in acute distress.    Appearance: Normal appearance. She is not toxic-appearing.  HENT:     Head: Normocephalic and atraumatic.  Pulmonary:     Effort: Pulmonary effort is normal.  Skin:    General: Skin is warm and dry.  Neurological:     Mental Status: She is alert and  oriented to person, place, and time. Mental status is at baseline.  Psychiatric:        Mood and Affect: Mood normal.        Behavior: Behavior normal.        Thought Content: Thought content normal.        Judgment: Judgment normal.       Results:   Studies obtained and personally reviewed by me:    Labs:       Component Value Date/Time   NA 138 08/27/2022 1604   K 3.8 08/27/2022 1604   CL 107 08/27/2022 1604   CO2 22 08/27/2022 1604   GLUCOSE 123 (H) 08/27/2022 1604   BUN 18 08/27/2022 1604   CREATININE 1.10 (H) 08/27/2022 1604   CREATININE 0.81 03/27/2022 1243   CALCIUM 9.4 08/27/2022 1604   PROT 6.8 11/24/2022 1008   ALBUMIN 3.7 08/27/2022  1604   AST 13 11/24/2022 1008   ALT 9 11/24/2022 1008   ALKPHOS 58 08/27/2022 1604   BILITOT 0.7 11/24/2022 1008   GFRNONAA 47 (L) 08/27/2022 1604   GFRNONAA 72 05/07/2021 0942   GFRAA 83 05/07/2021 0942     Lab Results  Component Value Date   WBC 7.0 10/06/2022   HGB 11.3 (L) 10/06/2022   HCT 35.4 (L) 10/06/2022   MCV 95.4 10/06/2022   PLT 299 10/06/2022    Lab Results  Component Value Date   CHOL 126 11/24/2022   HDL 54 11/24/2022   LDLCALC 57 11/24/2022   TRIG 74 11/24/2022   CHOLHDL 2.3 11/24/2022    Lab Results  Component Value Date   HGBA1C 5.2 11/24/2022     Lab Results  Component Value Date   TSH 1.12 03/27/2022      Assessment & Plan:    Acute Upper respiratory infection: prescribed Azithromycin Z-Pak suspension two teaspoons day 1 followed by one teaspoon days 2-5. Can take Tessalon Perles three times daily. .Encourage fluids. Try to eat something. Call if symptoms worsen or not responding within 48 hours.    I,Alexander Ruley,acting as a Neurosurgeon for Margaree Mackintosh, MD.,have documented all relevant documentation on the behalf of Margaree Mackintosh, MD,as directed by  Margaree Mackintosh, MD while in the presence of Margaree Mackintosh, MD.   I, Margaree Mackintosh, MD, have reviewed all documentation for this visit. The documentation on 03/02/23 for the exam, diagnosis, procedures, and orders are all accurate and complete.

## 2023-03-02 NOTE — Patient Instructions (Addendum)
Take Zithromax suspension 2 teaspoons day 1 then 1 teaspoon days 2 through 5.  May take [facility] for cough.  Rest at home.  Try to walk around some to prevent atelectasis.  Call if symptoms worsen such as shortness of breath, shaking chills, decreased appetite or fluid intake.

## 2023-03-02 NOTE — Telephone Encounter (Signed)
Scheduled for video visit.

## 2023-03-02 NOTE — Telephone Encounter (Signed)
Patients daughter [name] called and said that patient has a bad cough and not getting much rest, also nose running. The daughter said she had the same thing last week. She said she would like to get her mom seen today. Is this okay and if so when would you like to see her? [phone]

## 2023-03-17 ENCOUNTER — Telehealth: Payer: Self-pay

## 2023-03-17 NOTE — Telephone Encounter (Signed)
Returned call to pt daughter patricia, order placed for home hoyer lift per Lowella Bandy, NP. Pt also reported to have diarrhea, discussed with daughter on reducing Mirilax to every other day. Pt daughter verbalized understanding, no further needs at this time.

## 2023-04-13 ENCOUNTER — Other Ambulatory Visit: Payer: Self-pay | Admitting: Internal Medicine

## 2023-04-16 ENCOUNTER — Encounter: Payer: Self-pay | Admitting: Internal Medicine

## 2023-04-16 ENCOUNTER — Ambulatory Visit
Admission: RE | Admit: 2023-04-16 | Discharge: 2023-04-16 | Disposition: A | Discharge status: Home / Self Care | Payer: Medicare Other | Source: Ambulatory Visit | Attending: Internal Medicine | Admitting: Internal Medicine

## 2023-04-16 ENCOUNTER — Ambulatory Visit (INDEPENDENT_AMBULATORY_CARE_PROVIDER_SITE_OTHER): Payer: Medicare Other | Admitting: Internal Medicine

## 2023-04-16 ENCOUNTER — Telehealth: Payer: Self-pay | Admitting: Internal Medicine

## 2023-04-16 VITALS — BP 130/70 | HR 75 | Temp 97.1°F | Wt 92.0 lb

## 2023-04-16 DIAGNOSIS — R413 Other amnesia: Secondary | ICD-10-CM

## 2023-04-16 DIAGNOSIS — W19XXXA Unspecified fall, initial encounter: Secondary | ICD-10-CM | POA: Diagnosis not present

## 2023-04-16 DIAGNOSIS — L03115 Cellulitis of right lower limb: Secondary | ICD-10-CM | POA: Diagnosis not present

## 2023-04-16 DIAGNOSIS — E119 Type 2 diabetes mellitus without complications: Secondary | ICD-10-CM

## 2023-04-16 LAB — CBC WITH DIFFERENTIAL/PLATELET
Absolute Monocytes: 714 cells/uL (ref 200–950)
Basophils Absolute: 43 cells/uL (ref 0–200)
Basophils Relative: 0.5 %
Eosinophils Absolute: 187 cells/uL (ref 15–500)
Eosinophils Relative: 2.2 %
HCT: 33.6 % — ABNORMAL LOW (ref 35.0–45.0)
Hemoglobin: 10.7 g/dL — ABNORMAL LOW (ref 11.7–15.5)
Lymphs Abs: 2287 cells/uL (ref 850–3900)
MCH: 30.6 pg (ref 27.0–33.0)
MCHC: 31.8 g/dL — ABNORMAL LOW (ref 32.0–36.0)
MCV: 96 fL (ref 80.0–100.0)
MPV: 9 fL (ref 7.5–12.5)
Monocytes Relative: 8.4 %
Neutro Abs: 5270 cells/uL (ref 1500–7800)
Neutrophils Relative %: 62 %
Platelets: 327 10*3/uL (ref 140–400)
RBC: 3.5 10*6/uL — ABNORMAL LOW (ref 3.80–5.10)
RDW: 11.7 % (ref 11.0–15.0)
Total Lymphocyte: 26.9 %
WBC: 8.5 10*3/uL (ref 3.8–10.8)

## 2023-04-16 LAB — POCT GLUCOSE (DEVICE FOR HOME USE): Glucose Fasting, POC: 127 mg/dL — AB (ref 70–99)

## 2023-04-16 MED ORDER — PREDNISONE 10 MG PO TABS: ORAL_TABLET | ORAL | 0 refills | Status: DC

## 2023-04-16 MED ORDER — CEPHALEXIN 250 MG PO CAPS: 250.0000 mg | ORAL_CAPSULE | Freq: Three times a day (TID) | ORAL | 0 refills | Status: DC

## 2023-04-16 NOTE — Progress Notes (Signed)
Patient Care Team: Margaree Mackintosh, MD as PCP - General (Internal Medicine)  Visit Date: 04/16/23  Subjective:    Patient ID: Amber Chang , Female   DOB: 09-09-1927, 89 y.o.    MRN: [id number]   89 y.o. Female presents today for a right foot cellulitis that she noticed at some point following a fall on 04/07/23. Denies fever, shaking chills.  Past Medical History:  Diagnosis Date   Diabetes mellitus without complication (HCC)    Glaucoma    Hyperlipidemia    Hypertension      Family History  Problem Relation Age of Onset   Colon cancer Brother    Breast cancer Neg Hx    Ovarian cancer Neg Hx    Endometrial cancer Neg Hx    Stomach cancer Neg Hx    Esophageal cancer Neg Hx     Social History   Social History Narrative   Not on file      Review of Systems  Constitutional:  Negative for fever and malaise/fatigue.  HENT:  Negative for congestion.   Eyes:  Negative for blurred vision.  Respiratory:  Negative for cough and shortness of breath.   Cardiovascular:  Negative for chest pain, palpitations and leg swelling.  Gastrointestinal:  Negative for vomiting.  Musculoskeletal:  Negative for back pain.  Skin:  Negative for rash.       (+) Cellulitis dorsal right foot  Neurological:  Negative for loss of consciousness and headaches.        Objective:   Vitals: BP 130/70   Pulse 75   Temp (!) 97.1 F (36.2 C) (Tympanic)   Wt 92 lb (41.7 kg) Comment: reported by family 2 weeks ago  BMI 19.23 kg/m    Physical Exam Vitals and nursing note reviewed.  Constitutional:      General: She is not in acute distress.    Appearance: Normal appearance. She is not toxic-appearing.  HENT:     Head: Normocephalic and atraumatic.  Pulmonary:     Effort: Pulmonary effort is normal.  Feet:     Comments: 50 cent piece sized cellulitis that is erythematous and tender to touch on right foot dorsal aspect. Skin:    General: Skin is warm and dry.  Neurological:      Mental Status: She is alert and oriented to person, place, and time. Mental status is at baseline.  Psychiatric:        Mood and Affect: Mood normal.        Behavior: Behavior normal.        Thought Content: Thought content normal.        Judgment: Judgment normal.       Results:   Studies obtained and personally reviewed by me:   Labs:       Component Value Date/Time   NA 138 08/27/2022 1604   K 3.8 08/27/2022 1604   CL 107 08/27/2022 1604   CO2 22 08/27/2022 1604   GLUCOSE 123 (H) 08/27/2022 1604   BUN 18 08/27/2022 1604   CREATININE 1.10 (H) 08/27/2022 1604   CREATININE 0.81 03/27/2022 1243   CALCIUM 9.4 08/27/2022 1604   PROT 6.8 11/24/2022 1008   ALBUMIN 3.7 08/27/2022 1604   AST 13 11/24/2022 1008   ALT 9 11/24/2022 1008   ALKPHOS 58 08/27/2022 1604   BILITOT 0.7 11/24/2022 1008   GFRNONAA 47 (L) 08/27/2022 1604   GFRNONAA 72 05/07/2021 0942   GFRAA 83 05/07/2021 0942  Lab Results  Component Value Date   WBC 7.0 10/06/2022   HGB 11.3 (L) 10/06/2022   HCT 35.4 (L) 10/06/2022   MCV 95.4 10/06/2022   PLT 299 10/06/2022    Lab Results  Component Value Date   CHOL 126 11/24/2022   HDL 54 11/24/2022   LDLCALC 57 11/24/2022   TRIG 74 11/24/2022   CHOLHDL 2.3 11/24/2022    Lab Results  Component Value Date   HGBA1C 5.2 11/24/2022     Lab Results  Component Value Date   TSH 1.12 03/27/2022      Assessment & Plan:   Cellulitis dorsal aspect right foot: ordered right foot Xray. Prescribed cephalexin 250 mg three times daily for 7 days, and short course prednisone taper over 4 days. Ordered CBC with Diff/Plat.  Could possibly have gout but area on dorsum of foot is circular in appearance and may actually be an insect bite.  Covering for cellulitis with antibiotics.  Covering for gout with oral steroid short taper.  Rule out occult fracture with x-ray of foot.  She is afebrile and is tolerating this just fine.    I,Alexander Ruley,acting as a  Neurosurgeon for Margaree Mackintosh, MD.,have documented all relevant documentation on the behalf of Margaree Mackintosh, MD,as directed by  Margaree Mackintosh, MD while in the presence of Margaree Mackintosh, MD.   I, Margaree Mackintosh, MD, have reviewed all documentation for this visit. The documentation on 04/16/23 for the exam, diagnosis, procedures, and orders are all accurate and complete.

## 2023-04-16 NOTE — Patient Instructions (Addendum)
Take Keflex 3 times daily with a meal for 7 days and prednisone 10 mg starting with 4 tabs on day 1 and decrease by one tab daily 4-3-2-1 taper.  Please have x-ray of foot.  Glucose is stable.

## 2023-04-16 NOTE — Progress Notes (Signed)
Palliative Medicine [facility]  [phone] [phone]   Name: Amber Chang Date: 04/16/2023 MRN: [id number]  DOB: May 01, 1927  Patient Care Team: Margaree Mackintosh, MD as PCP - General (Internal Medicine)  INTERVAL HISTORY: Amber Chang is a 89 y.o. female with oncologic medical history including stage I ovarian cancer, s/p therapeutic cystic aspiration, CVA, hyperlipidemia, hypertension, heart failure, memory loss.  Palliative ask to see for symptom management and goals of care.   SOCIAL HISTORY:     reports that she has never smoked. She has never used smokeless tobacco. She reports that she does not currently use alcohol. She reports that she does not use drugs.  ADVANCE DIRECTIVES:  MOST, DNR, and Advanced Directives on file  CODE STATUS: DNR  PAST MEDICAL HISTORY: Past Medical History:  Diagnosis Date   Diabetes mellitus without complication (HCC)    Glaucoma    Hyperlipidemia    Hypertension     ALLERGIES:  is allergic to penicillins.  MEDICATIONS:  Current Outpatient Medications  Medication Sig Dispense Refill   acetaminophen (TYLENOL) 500 MG tablet Take 500 mg by mouth every 6 (six) hours as needed for moderate pain.     albuterol (VENTOLIN HFA) 108 (90 Base) MCG/ACT inhaler Inhale 2 puffs into the lungs every 6 (six) hours as needed for wheezing or shortness of breath. 6.7 g PRN   amLODipine (NORVASC) 10 MG tablet TAKE 1 TABLET(10 MG) BY MOUTH DAILY 90 tablet 1   aspirin EC 81 MG tablet Take 81 mg by mouth daily. Swallow whole.     azithromycin (ZITHROMAX) 200 MG/5ML suspension 2 teaspoon by mouth day 1 followed by one teaspoon days 2-5 30 mL 0   benzonatate (TESSALON) 100 MG capsule TAKE 1 CAPSULE(100 MG) BY MOUTH TWICE DAILY AS NEEDED FOR COUGH 60 capsule 5   cetirizine (ZYRTEC) 10 MG tablet Take 10 mg by mouth daily.     clopidogrel (PLAVIX) 75 MG tablet TAKE 1 TABLET(75 MG) BY MOUTH DAILY 90 tablet 3   donepezil  (ARICEPT) 5 MG tablet TAKE 1 TABLET(5 MG) BY MOUTH AT BEDTIME (Patient taking differently: Take 5 mg by mouth at bedtime.) 90 tablet 3   latanoprost (XALATAN) 0.005 % ophthalmic solution Place 1 drop into both eyes at bedtime.  0   losartan (COZAAR) 100 MG tablet Take 1 tablet (100 mg total) by mouth daily. 90 tablet 3   metFORMIN (GLUCOPHAGE) 500 MG tablet TAKE 1 TABLET(500 MG) BY MOUTH TWICE DAILY 180 tablet 3   metoprolol succinate (TOPROL-XL) 100 MG 24 hr tablet TAKE 1 TABLET BY MOUTH EVERY DAY WITH OR IMMEDIATELY FOLLOWING A MEAL 60 tablet 5   polyethylene glycol (MIRALAX / GLYCOLAX) 17 g packet Take 17 g by mouth daily.     rosuvastatin (CRESTOR) 20 MG tablet TAKE 1 TABLET(20 MG) BY MOUTH DAILY 90 tablet 3   Skin Protectants, Misc. (ENDIT EX) Apply 1 Application topically daily.     No current facility-administered medications for this visit.    VITAL SIGNS: There were no vitals taken for this visit. There were no vitals filed for this visit.  Estimated body mass index is 20.9 kg/m as calculated from the following:   Height as of 11/25/22: 4\' 10"  (1.473 m).   Weight as of 03/02/23: 100 lb (45.4 kg).   PERFORMANCE STATUS (ECOG) : 1 - Symptomatic but completely ambulatory  Assessment NAD, in wheelchair RRR Clear bilaterally Soft, nontender AAOx4  IMPRESSION:  Amber Chang  presents to clinic today for follow-up. No acute distress noted. In wheelchair. Daughter present. Patient does not speak much during visit. Is mainly smiling. Will occasionally respond.   Daughter shares recent decline in health. Patient is beginning to sleep more often, decreased appetite, gait instability with frequent falls, and occasional episodes of agitation.  Daughter is concerned that she is beginning to show signs of end-stage dementia.  We discussed hospice's goals and philosophy of care.  Education provided on expectations of care in the home as well as inpatient hospice unit.  Daughter verbalized  understanding and appreciation.  Expresses she knows at some point this may be most appropriate for her mother as well as for herself knowing that she does not wish for her to suffer.  [name] knows that this can be arranged at any time by notifying the medical team.  No symptom management needs at this time.  Family is aware we are available for ongoing support as needed.  I discussed the importance of continued conversation with family and their medical providers regarding overall plan of care and treatment options, ensuring decisions are within the context of the patients values and GOCs.  PLAN:  Goals remain clear, patient and daughter wishes to treat the treatable however with no aggressive interventions.  Their main focus is on patient's comfort and quality of life.  Education provided on outpatient hospice and inpatient hospice unit as patient began to show signs of decline and end-of-life.   No current symptom management needs at this time.  Family is taking things one day at a time. DNR/DNI and MOST form were completed on 11/12/22.  Ongoing support as needed.  Education provided on taking medication with pudding or applesauce to allow for better consistency as she complains of some difficulty at times with taking her pills.  Also encouraged her to drink prior to taking medication to moisturize mouth and esophagus.  Utilize chin tuck.  Daughter aware to contact office if she notices any further difficulty with eating or swallowing including coughing or choking with meals. I will plan to see patient back in 4-6 weeks in collaboration to other oncology appointments.  Daughter knows to contact office sooner if needed.   Patient expressed understanding and was in agreement with this plan. She also understands that She can call the clinic at any time with any questions, concerns, or complaints.    Visit consisted of counseling and education dealing with the complex and emotionally intense issues  of symptom management and palliative care in the setting of serious and potentially life-threatening illness.Greater than 50%  of this time was spent counseling and coordinating care related to the above assessment and plan.  Willette Alma, AGPCNP-BC  Palliative Medicine Team/ Cancer Center  *Please note that this is a verbal dictation therefore any spelling or grammatical errors are due to the "[employer]" system interpretation.

## 2023-04-16 NOTE — Telephone Encounter (Signed)
Nobie Putnam [phone]  Dennie Bible called to say her mother fell lat week on Tuesday 04/07/2023. She has a bruised foot that is red and hurts, she is wandering if she should bring her in for you to look at it, especially since she is a diabetic.

## 2023-04-16 NOTE — Telephone Encounter (Signed)
scheduled

## 2023-04-20 ENCOUNTER — Inpatient Hospital Stay (HOSPITAL_BASED_OUTPATIENT_CLINIC_OR_DEPARTMENT_OTHER): Payer: Medicare Other | Admitting: Nurse Practitioner

## 2023-04-20 ENCOUNTER — Encounter: Payer: Self-pay | Admitting: Nurse Practitioner

## 2023-04-20 ENCOUNTER — Inpatient Hospital Stay: Payer: Medicare Other | Attending: Psychiatry | Admitting: Psychiatry

## 2023-04-20 ENCOUNTER — Other Ambulatory Visit: Payer: Self-pay

## 2023-04-20 VITALS — BP 150/79 | HR 72 | Temp 98.0°F | Ht 59.06 in | Wt 95.6 lb

## 2023-04-20 DIAGNOSIS — C569 Malignant neoplasm of unspecified ovary: Secondary | ICD-10-CM

## 2023-04-20 DIAGNOSIS — R530 Neoplastic (malignant) related fatigue: Secondary | ICD-10-CM | POA: Diagnosis not present

## 2023-04-20 DIAGNOSIS — Z66 Do not resuscitate: Secondary | ICD-10-CM | POA: Insufficient documentation

## 2023-04-20 DIAGNOSIS — Z8543 Personal history of malignant neoplasm of ovary: Secondary | ICD-10-CM | POA: Insufficient documentation

## 2023-04-20 DIAGNOSIS — Z7189 Other specified counseling: Secondary | ICD-10-CM

## 2023-04-20 DIAGNOSIS — R630 Anorexia: Secondary | ICD-10-CM | POA: Diagnosis not present

## 2023-04-20 DIAGNOSIS — F0390 Unspecified dementia without behavioral disturbance: Secondary | ICD-10-CM | POA: Diagnosis present

## 2023-04-20 DIAGNOSIS — Z515 Encounter for palliative care: Secondary | ICD-10-CM | POA: Diagnosis not present

## 2023-04-20 NOTE — Patient Instructions (Signed)
It was a pleasure to see you in clinic today. - Overall everything is stable. - If you notice increased pain or belly feeling bigger, firmer, let me know and we can reevaluate for another drainage - Return visit planned for 3 mo  Thank you very much for allowing me to provide care for you today.  I appreciate your confidence in choosing our Gynecologic Oncology team at [facility].  If you have any questions about your visit today please call our office or send [place] a MyChart message and we will get back to you as soon as possible.

## 2023-04-20 NOTE — Progress Notes (Signed)
Gynecologic Oncology Return Clinic Visit  Date of Service: 04/20/2023 Referring Provider: Margaree Mackintosh, MD [address] [place],  [place] [address]  Assessment & Plan: Amber Chang is a 89 y.o. woman with clinical Stage I ovarian cancer, s/p therapeutic cystic aspiration who presents for follow-up.  Cystic ovarian mass, clinical stage I ovarian cancer: - Positive cytology on prior cyst drainage (performed by IR on 10/06/22) - Overall stable minimal symptoms. No pain. Regular bowel movements with assistance of miralax.  - Continue with surveillance, symptom management. Offered follow-up PRN given we are not actively intervening but they appreciate continued q51mo follow-up. - Continue with palliative care, follow-up today. For support, symptom management as needed.   RTC 3 months.  Clide Cliff, MD Gynecologic Oncology   Medical Decision Making I personally spent  TOTAL 15 minutes face-to-face and non-face-to-face in the care of this patient, which includes all pre, intra, and post visit time on the date of service.  ----------------------- Reason for Visit: Follow-up  Oncologic History: 08/27/22: ED for concern for rectal bleeding/near syncope. CT with 18.6cm pelvic mass 09/15/22: Initial consult. Decision to proceed with conservative management with IR drainage of mass given pt's age and goals of care 10/06/22: IR drainage of 1.8L of cyst fluid, malignant cells noted   Interval History: Pt presents today with her daughter. In wheelchair. Denies pain. Using miralax every other day which helps to reduce bowel incontinence. Daughter reports that the swallowing strategies recommended by palliative didn't help. She is sleeping a lot but this is overall stable. Has lost some weight (~5lb). Daughter reports a couple falls but no breaks and has not hit her head. They are using a hydraulic lift at home to help. She is drinking a boost once a day to help with decreased food  intake.    Past Medical/Surgical History: Past Medical History:  Diagnosis Date   Diabetes mellitus without complication (HCC)    Glaucoma    Hyperlipidemia    Hypertension     Past Surgical History:  Procedure Laterality Date   ABDOMINAL HYSTERECTOMY     Fibroids in her 64s   CYST EXCISION      Family History  Problem Relation Age of Onset   Colon cancer Brother    Breast cancer Neg Hx    Ovarian cancer Neg Hx    Endometrial cancer Neg Hx    Stomach cancer Neg Hx    Esophageal cancer Neg Hx     Social History   Socioeconomic History   Marital status: Widowed    Spouse name: Not on file   Number of children: Not on file   Years of education: Not on file   Highest education level: Not on file  Occupational History   Not on file  Tobacco Use   Smoking status: Never   Smokeless tobacco: Never  Vaping Use   Vaping Use: Never used  Substance and Sexual Activity   Alcohol use: Not Currently   Drug use: No   Sexual activity: Not Currently  Other Topics Concern   Not on file  Social History Narrative   Not on file   Social Determinants of Health   Financial Resource Strain: Not on file  Food Insecurity: Not on file  Transportation Needs: Not on file  Physical Activity: Not on file  Stress: Not on file  Social Connections: Not on file    Current Medications:  Current Outpatient Medications:    acetaminophen (TYLENOL) 500 MG tablet, Take 500 mg  by mouth every 6 (six) hours as needed for moderate pain., Disp: , Rfl:    albuterol (VENTOLIN HFA) 108 (90 Base) MCG/ACT inhaler, Inhale 2 puffs into the lungs every 6 (six) hours as needed for wheezing or shortness of breath., Disp: 6.7 g, Rfl: PRN   amLODipine (NORVASC) 10 MG tablet, TAKE 1 TABLET(10 MG) BY MOUTH DAILY, Disp: 90 tablet, Rfl: 1   aspirin EC 81 MG tablet, Take 81 mg by mouth daily. Swallow whole., Disp: , Rfl:    azithromycin (ZITHROMAX) 200 MG/5ML suspension, 2 teaspoon by mouth day 1 followed by  one teaspoon days 2-5, Disp: 30 mL, Rfl: 0   benzonatate (TESSALON) 100 MG capsule, TAKE 1 CAPSULE(100 MG) BY MOUTH TWICE DAILY AS NEEDED FOR COUGH, Disp: 60 capsule, Rfl: 5   cephALEXin (KEFLEX) 250 MG capsule, Take 1 capsule (250 mg total) by mouth 3 (three) times daily., Disp: 21 capsule, Rfl: 0   cetirizine (ZYRTEC) 10 MG tablet, Take 10 mg by mouth daily., Disp: , Rfl:    clopidogrel (PLAVIX) 75 MG tablet, TAKE 1 TABLET(75 MG) BY MOUTH DAILY, Disp: 90 tablet, Rfl: 3   donepezil (ARICEPT) 5 MG tablet, TAKE 1 TABLET(5 MG) BY MOUTH AT BEDTIME (Patient taking differently: Take 5 mg by mouth at bedtime.), Disp: 90 tablet, Rfl: 3   latanoprost (XALATAN) 0.005 % ophthalmic solution, Place 1 drop into both eyes at bedtime., Disp: , Rfl: 0   losartan (COZAAR) 100 MG tablet, Take 1 tablet (100 mg total) by mouth daily., Disp: 90 tablet, Rfl: 3   metFORMIN (GLUCOPHAGE) 500 MG tablet, TAKE 1 TABLET(500 MG) BY MOUTH TWICE DAILY, Disp: 180 tablet, Rfl: 3   metoprolol succinate (TOPROL-XL) 100 MG 24 hr tablet, TAKE 1 TABLET BY MOUTH EVERY DAY WITH OR IMMEDIATELY FOLLOWING A MEAL, Disp: 60 tablet, Rfl: 5   polyethylene glycol (MIRALAX / GLYCOLAX) 17 g packet, Take 17 g by mouth daily., Disp: , Rfl:    predniSONE (DELTASONE) 10 MG tablet, Take 4 tabs on day 1, 3 tabs on day 2, 2 tabs on day 3 and 1 tab on day 4., Disp: 10 tablet, Rfl: 0   rosuvastatin (CRESTOR) 20 MG tablet, TAKE 1 TABLET(20 MG) BY MOUTH DAILY, Disp: 90 tablet, Rfl: 3   Skin Protectants, Misc. (ENDIT EX), Apply 1 Application topically daily., Disp: , Rfl:   Review of Symptoms: Complete 10-system review is positive for easy bruising/bleeding, diarrhea, swelling of the legs  Physical Exam: BP (!) 150/79 (BP Location: Left Arm, Patient Position: Sitting)   Pulse 72   Temp 98 F (36.7 C)   Ht 4' 11.06" (1.5 m)   Wt 95 lb 9.6 oz (43.4 kg)   BMI 19.27 kg/m  General: Alert, oriented, no acute distress.Sitting comfortably in  wheelchair HEENT: Normocephalic, atraumatic. Neck symmetric without masses. Sclera anicteric. Chest: Normal work of breathing. Few crackles in lung bases. Abdomen: Soft, nontender.  Normoactive bowel sounds.  Mildly distended and tympanitic. No discrete mass palpated on abdominal exam. No fluid wave. Extremities: Grossly normal range of motion.  Warm, well perfused.  Mild edema bilaterally. Skin: No rashes or lesions noted.   Laboratory & Radiologic Studies: None

## 2023-05-03 ENCOUNTER — Encounter: Payer: Self-pay | Admitting: Psychiatry

## 2023-05-03 DIAGNOSIS — C569 Malignant neoplasm of unspecified ovary: Secondary | ICD-10-CM | POA: Insufficient documentation

## 2023-05-05 ENCOUNTER — Encounter: Payer: Self-pay | Admitting: Podiatry

## 2023-05-05 ENCOUNTER — Ambulatory Visit (INDEPENDENT_AMBULATORY_CARE_PROVIDER_SITE_OTHER): Payer: Medicare Other | Admitting: Podiatry

## 2023-05-05 DIAGNOSIS — E119 Type 2 diabetes mellitus without complications: Secondary | ICD-10-CM

## 2023-05-05 DIAGNOSIS — M79675 Pain in left toe(s): Secondary | ICD-10-CM

## 2023-05-05 DIAGNOSIS — M79674 Pain in right toe(s): Secondary | ICD-10-CM

## 2023-05-05 DIAGNOSIS — B351 Tinea unguium: Secondary | ICD-10-CM | POA: Diagnosis not present

## 2023-05-05 NOTE — Progress Notes (Addendum)
This patient returns to my office for at risk foot care.  This patient requires this care by a professional since this patient will be at risk due to having diabetes and coagulation defect.  Patient is taking plavix.  This patient is unable to cut nails herself since the patient cannot reach hiernails.These nails are painful walking and wearing shoes.  This patient presents for at risk foot care today.  General Appearance  Alert, conversant and in no acute stress.  Vascular  Dorsalis pedis and posterior tibial  pulses are palpable  bilaterally.  Capillary return is within normal limits  bilaterally. Temperature is within normal limits  bilaterally.  Neurologic  Senn-Weinstein monofilament wire test within normal limits  bilaterally. Muscle power within normal limits bilaterally.  Nails Thick disfigured discolored nails with subungual debris  from hallux to fifth toes bilaterally. No evidence of bacterial infection or drainage bilaterally.  Orthopedic  No limitations of motion  feet .  No crepitus or effusions noted.  No bony pathology or digital deformities noted.  Skin  normotropic skin with no porokeratosis noted bilaterally.  No signs of infections or ulcers noted.     Onychomycosis  Pain in right toes  Pain in left toes  Consent was obtained for treatment procedures.   Mechanical debridement of nails 1-5  bilaterally performed with a nail nipper.  Filed with dremel without incident.    Return office visit  prn                  Told patient to return for periodic foot care and evaluation due to potential at risk complications.   Helane Gunther DPM

## 2023-05-19 NOTE — Progress Notes (Signed)
Annual Wellness Visit    Patient Care Team: Margaree Mackintosh, MD as PCP - General (Internal Medicine)  Visit Date: 05/26/23   No chief complaint on file.   Subjective:   Patient: Amber Chang, Female    DOB: [birth year], 89 y.o.   MRN: [id number]  Amber Chang is a 89 y.o. Female who presents today for her Annual Wellness Visit.  She is accompanied by her daughter today, who is her main caretaker. Discussed transitioning from palliative to hospice care. History of stage I ovarian cancer. Seen at [facility] every 3 months. Sleeps frequently.  History of Type 2 diabetes mellitus treated with metformin 500 mg twice daily. HGBA1c at 5.2%.  History of hyperlipidemia treated with rosuvastatin 20 mg daily. Lipid panel normal.  History of hypertension amlodipine 10 mg daily, losartan 100 mg daily, metoprolol succinate 100 mg daily with or immediately following meal. Blood pressure normal today at 128/72. History of dependent edema. Lower extremity swelling has been worse in hot weather.  She had an episode of syncope in May 2023 and was seen in the emergency department.  This was felt to be noncardiac syncope.  She was seen here in follow-up 4 days later.  It seemed to be related to having to go to the restroom and having a bowel movement.  She became nauseated and had nonbloody emesis.  Had brief loss of consciousness and slumped forward.  She came to within 1 to 2 minutes.  In my opinion she had vasovagal syncope and the ER physician came to the same conclusion.  Labs checked in May here were stable.  She has mild dementia and takes Aricept.  Aricept was started in 2020 for mild memory loss. She has been having regular episodes of agitation at night.   History of glaucoma treated with Xalatan and Combigan.  She takes Zyrtec for allergic rhinitis symptoms.   She has history of stroke in the remote past but it is not clear from her old records when that occurred.  She  has been maintained on Plavix and aspirin.       She has been seen by Dr. Merlyn Lot for rupture of extensor tendons of right hand and wrist and has trigger finger of right fifth finger.  She has onychomycosis of her toenails.  We have recommended Podiatrist see her.   Glucose slightly elevated at 101. Creatinine elevated at 1.01, GFR low at 52. Liver functions normal. Electrolytes normal. Blood proteins normal. History of anemia. RBC low at 3.42, hemoglobin low at 10.7, HCT low at 33.4.Does not take any vitamins. TSH at 1.06.  She had colonoscopy in 2001. Had hysterectomy at age 59.  Social history: She retired from [employer] [place] [employer].  Her husband was a Naval architect but he passed away.  She worked as a Water engineer.  She does not smoke or consume alcohol.  Now lives with her daughter here in [place].  Her daughter is a widow.   Family history: Father died at age 56 with history of stroke and pneumonia.  Mother died at age 34 of a stroke.  10 brothers are all deceased.  2 brothers died of strokes.  3 brothers died of cancer.  1 brother died of brain cancer.  2 brothers died of GI cancers.  All brothers had hypertension.  1 daughter died at age 35 in an automobile accident.  Daughter, Amber Chang with whom she resides, has history of cardiomyopathy and hypertension.  Past Medical History:  Diagnosis Date   Diabetes mellitus without complication (HCC)    Glaucoma    Hyperlipidemia    Hypertension      Family History  Problem Relation Age of Onset   Colon cancer Brother    Breast cancer Neg Hx    Ovarian cancer Neg Hx    Endometrial cancer Neg Hx    Stomach cancer Neg Hx    Esophageal cancer Neg Hx          Review of Systems  Constitutional:  Negative for chills, fever, malaise/fatigue and weight loss.  HENT:  Negative for hearing loss, sinus pain and sore throat.   Respiratory:  Negative for cough, hemoptysis and shortness of breath.   Cardiovascular:  Positive  for leg swelling. Negative for chest pain, palpitations and PND.  Gastrointestinal:  Negative for abdominal pain, constipation, diarrhea, heartburn, nausea and vomiting.  Genitourinary:  Negative for dysuria, frequency and urgency.  Musculoskeletal:  Negative for back pain, myalgias and neck pain.  Skin:  Negative for itching and rash.  Neurological:  Negative for dizziness, tingling, seizures and headaches.  Endo/Heme/Allergies:  Negative for polydipsia.  Psychiatric/Behavioral:  Negative for depression. The patient is not nervous/anxious.       Objective:   Vitals: BP 128/72   Pulse 63   Resp 14   Wt 96 lb 8 oz (43.8 kg)   SpO2 95%   BMI 19.45 kg/m   Physical Exam Vitals and nursing note reviewed.  Constitutional:      General: She is not in acute distress.    Appearance: Normal appearance. She is not ill-appearing or toxic-appearing.     Comments: Slightly frail appearing. Pleasant, cooperative.  HENT:     Head: Normocephalic and atraumatic.     Right Ear: Hearing, tympanic membrane, ear canal and external ear normal.     Left Ear: Hearing, tympanic membrane, ear canal and external ear normal.     Mouth/Throat:     Mouth: Mucous membranes are moist.     Pharynx: Oropharynx is clear.  Eyes:     Extraocular Movements: Extraocular movements intact.     Pupils: Pupils are equal, round, and reactive to light.  Neck:     Thyroid: No thyroid mass, thyromegaly or thyroid tenderness.     Vascular: No carotid bruit.  Cardiovascular:     Rate and Rhythm: Normal rate and regular rhythm. No extrasystoles are present.    Pulses:          Dorsalis pedis pulses are 1+ on the right side and 1+ on the left side.     Heart sounds: Normal heart sounds. No murmur heard.    No friction rub. No gallop.  Pulmonary:     Effort: Pulmonary effort is normal.     Breath sounds: Normal breath sounds. No decreased breath sounds, wheezing, rhonchi or rales.  Chest:     Chest wall: No mass.   Abdominal:     Palpations: Abdomen is soft. There is no hepatomegaly, splenomegaly or mass.     Tenderness: There is no abdominal tenderness.     Hernia: No hernia is present.  Musculoskeletal:     Cervical back: Normal range of motion.     Right lower leg: 1+ Pitting Edema present.     Left lower leg: 1+ Pitting Edema present.  Lymphadenopathy:     Cervical: No cervical adenopathy.     Upper Body:     Right upper body: No supraclavicular  adenopathy.     Left upper body: No supraclavicular adenopathy.  Skin:    General: Skin is warm and dry.  Neurological:     General: No focal deficit present.     Mental Status: She is alert and oriented to person, place, and time. Mental status is at baseline.     Sensory: Sensation is intact.     Motor: Motor function is intact. No weakness.     Deep Tendon Reflexes: Reflexes are normal and symmetric.  Psychiatric:        Attention and Perception: Attention normal.        Mood and Affect: Mood normal.        Speech: Speech normal.        Behavior: Behavior normal.        Thought Content: Thought content normal.        Cognition and Memory: Cognition normal.        Judgment: Judgment normal.     Most recent functional status assessment:     No data to display         Most recent fall risk assessment:    05/26/2023   11:09 AM  Fall Risk   Falls in the past year? 1  Number falls in past yr: 1  Injury with Fall? 0  Risk for fall due to : History of fall(s);Impaired mobility;Impaired balance/gait;Mental status change  Follow up Falls evaluation completed    Most recent depression screenings:    03/02/2023    2:20 PM 11/25/2022    9:43 AM  PHQ 2/9 Scores  PHQ - 2 Score 0 0   Most recent cognitive screening:    05/26/2023   11:10 AM  6CIT Screen  What Year? 4 points  What month? 3 points  What time? 3 points  Count back from 20 4 points  Months in reverse 4 points  Repeat phrase 10 points  Total Score 28 points      Results:   Studies obtained and personally reviewed by me:  She had colonoscopy in 2001.  Labs:       Component Value Date/Time   NA 141 05/25/2023 0941   K 3.8 05/25/2023 0941   CL 104 05/25/2023 0941   CO2 29 05/25/2023 0941   GLUCOSE 101 (H) 05/25/2023 0941   BUN 18 05/25/2023 0941   CREATININE 1.01 (H) 05/25/2023 0941   CALCIUM 9.7 05/25/2023 0941   PROT 6.4 05/25/2023 0941   ALBUMIN 3.7 08/27/2022 1604   AST 15 05/25/2023 0941   ALT 14 05/25/2023 0941   ALKPHOS 58 08/27/2022 1604   BILITOT 0.7 05/25/2023 0941   GFRNONAA 47 (L) 08/27/2022 1604   GFRNONAA 72 05/07/2021 0942   GFRAA 83 05/07/2021 0942     Lab Results  Component Value Date   WBC 6.3 05/25/2023   HGB 10.7 (L) 05/25/2023   HCT 33.4 (L) 05/25/2023   MCV 97.7 05/25/2023   PLT 314 05/25/2023    Lab Results  Component Value Date   CHOL 118 05/25/2023   HDL 58 05/25/2023   LDLCALC 44 05/25/2023   TRIG 77 05/25/2023   CHOLHDL 2.0 05/25/2023    Lab Results  Component Value Date   HGBA1C 5.2 05/25/2023     Lab Results  Component Value Date   TSH 1.06 05/25/2023    Assessment & Plan:   Type 2 diabetes mellitus: treated with metformin 500 mg twice daily. HGBA1c at 5.2%.  Hyperlipidemia: treated with rosuvastatin 20 mg  daily. Lipid panel normal.  Hypertension: start amlodipine 5 mg daily. Continue losartan 100 mg daily, metoprolol succinate 100 mg daily with or immediately following meal. Blood pressure normal today at 128/72.  Dependent edema: lower extremity swelling has been worse in hot weather.  Mild dementia: treated with Aricept since 2020. Notes little change with this. She has been having regular episodes of agitation at night. Prescribed mellaril 10 mg three times daily as needed for anxiety.  Glaucoma: treated with Xalatan and Combigan.    Allergic rhinitis: takes Zyrtec 10 mg daily.   History of stroke in the remote past.  She has been maintained on Plavix and aspirin.      Normocytic Anemia: RBC low at 3.42, hemoglobin low at 10.7, HCT low at 33.4. Multivitamin recommended. Recheck CBC at next visit.  Urinalysis positive for protein and urobilinogen at 0.2.  She had colonoscopy in 2001.   Had hysterectomy at age 40.  Ovarian malignancy-has been seen by GYN oncology and diagnosed with stage I ovarian cancer.  Palliative care recommended.  Daughter is okay with that.  Vaccine counseling: UTD on flu vaccine.  Return in one year or as needed. Has Hospice involved in her care.     Annual wellness visit done today including the all of the following: Reviewed patient's Family Medical History Reviewed and updated list of patient's medical providers Assessment of cognitive impairment was done Assessed patient's functional ability Established a written schedule for health screening services Health Risk Assessent Completed and Reviewed  Discussed health benefits of physical activity, and encouraged her to engage in regular exercise appropriate for her age and condition.        I,Alexander Ruley,acting as a Neurosurgeon for Margaree Mackintosh, MD.,have documented all relevant documentation on the behalf of Margaree Mackintosh, MD,as directed by  Margaree Mackintosh, MD while in the presence of Margaree Mackintosh, MD.   I, Margaree Mackintosh, MD, have reviewed all documentation for this visit. The documentation on 06/10/23 for the exam, diagnosis, procedures, and orders are all accurate and complete.

## 2023-05-25 ENCOUNTER — Other Ambulatory Visit: Payer: Medicare Other

## 2023-05-25 DIAGNOSIS — E7849 Other hyperlipidemia: Secondary | ICD-10-CM

## 2023-05-25 DIAGNOSIS — E119 Type 2 diabetes mellitus without complications: Secondary | ICD-10-CM

## 2023-05-25 DIAGNOSIS — Z1329 Encounter for screening for other suspected endocrine disorder: Secondary | ICD-10-CM

## 2023-05-25 DIAGNOSIS — I10 Essential (primary) hypertension: Secondary | ICD-10-CM

## 2023-05-25 LAB — CBC WITH DIFFERENTIAL/PLATELET
Absolute Monocytes: 567 cells/uL (ref 200–950)
Basophils Absolute: 32 cells/uL (ref 0–200)
Basophils Relative: 0.5 %
Lymphs Abs: 1657 cells/uL (ref 850–3900)
MCH: 31.3 pg (ref 27.0–33.0)
MCHC: 32 g/dL (ref 32.0–36.0)
MCV: 97.7 fL (ref 80.0–100.0)
Neutrophils Relative %: 62.5 %
Platelets: 314 10*3/uL (ref 140–400)
RBC: 3.42 10*6/uL — ABNORMAL LOW (ref 3.80–5.10)
RDW: 11.3 % (ref 11.0–15.0)
Total Lymphocyte: 26.3 %
WBC: 6.3 10*3/uL (ref 3.8–10.8)

## 2023-05-25 NOTE — Progress Notes (Signed)
 Lab only 

## 2023-05-26 ENCOUNTER — Ambulatory Visit (INDEPENDENT_AMBULATORY_CARE_PROVIDER_SITE_OTHER): Payer: Medicare Other | Admitting: Internal Medicine

## 2023-05-26 ENCOUNTER — Encounter: Payer: Self-pay | Admitting: Internal Medicine

## 2023-05-26 VITALS — BP 128/72 | HR 63 | Resp 14 | Wt 96.5 lb

## 2023-05-26 DIAGNOSIS — I509 Heart failure, unspecified: Secondary | ICD-10-CM

## 2023-05-26 DIAGNOSIS — G3184 Mild cognitive impairment, so stated: Secondary | ICD-10-CM

## 2023-05-26 DIAGNOSIS — E782 Mixed hyperlipidemia: Secondary | ICD-10-CM

## 2023-05-26 DIAGNOSIS — E119 Type 2 diabetes mellitus without complications: Secondary | ICD-10-CM

## 2023-05-26 DIAGNOSIS — Z0000 Encounter for general adult medical examination without abnormal findings: Secondary | ICD-10-CM

## 2023-05-26 DIAGNOSIS — H409 Unspecified glaucoma: Secondary | ICD-10-CM

## 2023-05-26 DIAGNOSIS — I10 Essential (primary) hypertension: Secondary | ICD-10-CM

## 2023-05-26 DIAGNOSIS — R413 Other amnesia: Secondary | ICD-10-CM

## 2023-05-26 DIAGNOSIS — E1169 Type 2 diabetes mellitus with other specified complication: Secondary | ICD-10-CM

## 2023-05-26 DIAGNOSIS — M65351 Trigger finger, right little finger: Secondary | ICD-10-CM

## 2023-05-26 DIAGNOSIS — E785 Hyperlipidemia, unspecified: Secondary | ICD-10-CM

## 2023-05-26 DIAGNOSIS — Z8673 Personal history of transient ischemic attack (TIA), and cerebral infarction without residual deficits: Secondary | ICD-10-CM

## 2023-05-26 DIAGNOSIS — Z7901 Long term (current) use of anticoagulants: Secondary | ICD-10-CM

## 2023-05-26 DIAGNOSIS — D649 Anemia, unspecified: Secondary | ICD-10-CM

## 2023-05-26 DIAGNOSIS — C569 Malignant neoplasm of unspecified ovary: Secondary | ICD-10-CM

## 2023-05-26 LAB — LIPID PANEL
Cholesterol: 118 mg/dL (ref <200)
HDL: 58 mg/dL (ref >=50)
LDL Cholesterol (Calc): 44 mg/dL (calc)
Non-HDL Cholesterol (Calc): 60 mg/dL (calc) (ref <130)
Total CHOL/HDL Ratio: 2 (calc) (ref <5.0)
Triglycerides: 77 mg/dL (ref <150)

## 2023-05-26 LAB — COMPLETE METABOLIC PANEL WITH GFR
AG Ratio: 1.8 (calc) (ref 1.0–2.5)
ALT: 14 U/L (ref 6–29)
AST: 15 U/L (ref 10–35)
Albumin: 4.1 g/dL (ref 3.6–5.1)
Alkaline phosphatase (APISO): 61 U/L (ref 37–153)
BUN/Creatinine Ratio: 18 (calc) (ref 6–22)
BUN: 18 mg/dL (ref 7–25)
CO2: 29 mmol/L (ref 20–32)
Calcium: 9.7 mg/dL (ref 8.6–10.4)
Chloride: 104 mmol/L (ref 98–110)
Creat: 1.01 mg/dL — ABNORMAL HIGH (ref 0.60–0.95)
Globulin: 2.3 g/dL (calc) (ref 1.9–3.7)
Glucose, Bld: 101 mg/dL — ABNORMAL HIGH (ref 65–99)
Potassium: 3.8 mmol/L (ref 3.5–5.3)
Sodium: 141 mmol/L (ref 135–146)
Total Bilirubin: 0.7 mg/dL (ref 0.2–1.2)
Total Protein: 6.4 g/dL (ref 6.1–8.1)
eGFR: 52 mL/min/{1.73_m2} — ABNORMAL LOW (ref >=60)

## 2023-05-26 LAB — CBC WITH DIFFERENTIAL/PLATELET
Eosinophils Absolute: 107 cells/uL (ref 15–500)
Eosinophils Relative: 1.7 %
HCT: 33.4 % — ABNORMAL LOW (ref 35.0–45.0)
Hemoglobin: 10.7 g/dL — ABNORMAL LOW (ref 11.7–15.5)
MPV: 8.9 fL (ref 7.5–12.5)
Monocytes Relative: 9 %
Neutro Abs: 3938 cells/uL (ref 1500–7800)

## 2023-05-26 LAB — POCT URINALYSIS DIPSTICK
Bilirubin, UA: NEGATIVE
Blood, UA: NEGATIVE
Glucose, UA: NEGATIVE
Ketones, UA: NEGATIVE
Leukocytes, UA: NEGATIVE
Nitrite, UA: NEGATIVE
Protein, UA: POSITIVE — AB
Spec Grav, UA: 1.015 (ref 1.010–1.025)
Urobilinogen, UA: 0.2 E.U./dL — AB
pH, UA: 8 (ref 5.0–8.0)

## 2023-05-26 LAB — HEMOGLOBIN A1C
Hgb A1c MFr Bld: 5.2 % of total Hgb (ref <5.7)
Mean Plasma Glucose: 103 mg/dL
eAG (mmol/L): 5.7 mmol/L

## 2023-05-26 LAB — TSH: TSH: 1.06 mIU/L (ref 0.40–4.50)

## 2023-05-26 MED ORDER — THIORIDAZINE HCL 10 MG PO TABS: 10.0000 mg | ORAL_TABLET | Freq: Three times a day (TID) | ORAL | 0 refills | Status: DC | PRN

## 2023-05-26 MED ORDER — AMLODIPINE BESYLATE 10 MG PO TABS: 5.0000 mg | ORAL_TABLET | Freq: Every day | ORAL | Status: DC

## 2023-06-02 ENCOUNTER — Inpatient Hospital Stay: Payer: Medicare Other | Attending: Psychiatry | Admitting: Nurse Practitioner

## 2023-06-02 ENCOUNTER — Encounter: Payer: Self-pay | Admitting: Nurse Practitioner

## 2023-06-02 VITALS — BP 153/78 | HR 73 | Temp 98.2°F | Resp 16

## 2023-06-02 DIAGNOSIS — C569 Malignant neoplasm of unspecified ovary: Secondary | ICD-10-CM

## 2023-06-02 DIAGNOSIS — Z7189 Other specified counseling: Secondary | ICD-10-CM | POA: Diagnosis not present

## 2023-06-02 DIAGNOSIS — R530 Neoplastic (malignant) related fatigue: Secondary | ICD-10-CM | POA: Diagnosis not present

## 2023-06-02 DIAGNOSIS — Z515 Encounter for palliative care: Secondary | ICD-10-CM

## 2023-06-02 DIAGNOSIS — R180 Malignant ascites: Secondary | ICD-10-CM

## 2023-06-02 NOTE — Progress Notes (Signed)
Palliative Medicine [facility]  Telephone:(336) [phone] [phone]) [phone]   Name: Amber Chang Date: 06/02/2023 MRN: [id number]  DOB: 03-Feb-1927  Patient Care Team: Margaree Mackintosh, MD as PCP - General (Internal Medicine)   INTERVAL HISTORY: Amber Chang is a 89 y.o. female with oncologic medical history including stage I ovarian cancer, s/p therapeutic cystic aspiration, CVA, hyperlipidemia, hypertension, heart failure, memory loss.  Palliative ask to see for symptom management and goals of care.   SOCIAL HISTORY:     reports that she has never smoked. She has never used smokeless tobacco. She reports that she does not currently use alcohol. She reports that she does not use drugs.  ADVANCE DIRECTIVES:  MOST, DNR, and Advanced Directives on file  CODE STATUS: DNR  PAST MEDICAL HISTORY: Past Medical History:  Diagnosis Date   Diabetes mellitus without complication (HCC)    Glaucoma    Hyperlipidemia    Hypertension     ALLERGIES:  is allergic to penicillins.  MEDICATIONS:  Current Outpatient Medications  Medication Sig Dispense Refill   acetaminophen (TYLENOL) 500 MG tablet Take 500 mg by mouth every 6 (six) hours as needed for moderate pain.     albuterol (VENTOLIN HFA) 108 (90 Base) MCG/ACT inhaler Inhale 2 puffs into the lungs every 6 (six) hours as needed for wheezing or shortness of breath. 6.7 g PRN   aspirin EC 81 MG tablet Take 81 mg by mouth daily. Swallow whole.     azithromycin (ZITHROMAX) 200 MG/5ML suspension 2 teaspoon by mouth day 1 followed by one teaspoon days 2-5 30 mL 0   benzonatate (TESSALON) 100 MG capsule TAKE 1 CAPSULE(100 MG) BY MOUTH TWICE DAILY AS NEEDED FOR COUGH 60 capsule 5   cephALEXin (KEFLEX) 250 MG capsule Take 1 capsule (250 mg total) by mouth 3 (three) times daily. 21 capsule 0   cetirizine (ZYRTEC) 10 MG tablet Take 10 mg by mouth daily.     clopidogrel (PLAVIX) 75 MG tablet TAKE 1 TABLET(75 MG) BY MOUTH  DAILY 90 tablet 3   donepezil (ARICEPT) 5 MG tablet TAKE 1 TABLET(5 MG) BY MOUTH AT BEDTIME (Patient taking differently: Take 5 mg by mouth at bedtime.) 90 tablet 3   latanoprost (XALATAN) 0.005 % ophthalmic solution Place 1 drop into both eyes at bedtime.  0   losartan (COZAAR) 100 MG tablet Take 1 tablet (100 mg total) by mouth daily. 90 tablet 3   metFORMIN (GLUCOPHAGE) 500 MG tablet TAKE 1 TABLET(500 MG) BY MOUTH TWICE DAILY 180 tablet 3   metoprolol succinate (TOPROL-XL) 100 MG 24 hr tablet TAKE 1 TABLET BY MOUTH EVERY DAY WITH OR IMMEDIATELY FOLLOWING A MEAL 60 tablet 5   polyethylene glycol (MIRALAX / GLYCOLAX) 17 g packet Take 17 g by mouth daily.     predniSONE (DELTASONE) 10 MG tablet Take 4 tabs on day 1, 3 tabs on day 2, 2 tabs on day 3 and 1 tab on day 4. 10 tablet 0   rosuvastatin (CRESTOR) 20 MG tablet TAKE 1 TABLET(20 MG) BY MOUTH DAILY 90 tablet 3   Skin Protectants, Misc. (ENDIT EX) Apply 1 Application topically daily.     thioridazine (MELLARIL) 10 MG tablet Take 1 tablet (10 mg total) by mouth 3 (three) times daily as needed for anxiety. 30 tablet 0   No current facility-administered medications for this visit.    VITAL SIGNS: There were no vitals taken for this visit. There were no vitals filed  for this visit.  Estimated body mass index is 19.45 kg/m as calculated from the following:   Height as of 04/20/23: 4' 11.06" (1.5 m).   Weight as of 05/26/23: 96 lb 8 oz (43.8 kg).   PERFORMANCE STATUS (ECOG) : 1 - Symptomatic but completely ambulatory  Assessment NAD, in wheelchair RRR Clear bilaterally Distended, firm, grimaces on palpation, +BS Bilateral lower extremity trace edema AAOx4  IMPRESSION: Amber Chang presents to clinic today with her daughter. Patient in a wheelchair. Recent PCP visit and started on Mellaril as needed for anxiety. Daughter states she has not given to patient as of yet. She has not had any recurrent agitation events. Some noticeable  sundowners at times. Does not seem to recognize daughter during episodes.   Patient wearing briefs. Appetite continues to fluctuate. Daughter reports some days are better than others. Patient will eat good but other occasions may only take several bites. We discussed offering foods that she enjoys. Education provided on comfort feeds.   Amber Chang abdomen is distended. Daughter express concerns as this has become much more noticeable. Patient grimacing on exam. Daughter states occasional complaints from patient about "stomach ache". She had paracentesis in 09/2022 yielding 1.8L. discussed at length with daughter regarding repeat procedure and if she would want to pursue. Versus managing symptoms. Amber Chang states her mother tolerated fine and would like to have further evaluated and proceed with procedure if warranted understanding risk and benefits.   We will continue to support and follow. Ongoing discussions regarding hospice and support. Daughter is aware services may be initiated at anytime by notifying medical team. She is clear in expressed wishes to continue taking things one day at a time treating the treatable. She is realistic in her understanding and expectations.   Family is aware we are available for ongoing support as needed.  I discussed the importance of continued conversation with family and their medical providers regarding overall plan of care and treatment options, ensuring decisions are within the context of the patients values and GOCs.  PLAN:  Goals remain clear, patient and daughter wishes to treat the treatable however with no aggressive interventions.  Their main focus is on patient's comfort and quality of life.  Education provided on outpatient hospice and inpatient hospice unit as patient began to show signs of decline and end-of-life.   Family is taking things one day at a time. DNR/DNI and MOST form were completed on 11/12/22.  Ongoing support as needed.  Education  provided on comfort focused meals. IR US guided paracentesis if warranted. I will plan to see patient back in 2-3 weeks in collaboration to other oncology appointments.  Daughter knows to contact office sooner if needed.   Patient expressed understanding and was in agreement with this plan. She also understands that She can call the clinic at any time with any questions, concerns, or complaints.    Visit consisted of counseling and education dealing with the complex and emotionally intense issues of symptom management and palliative care in the setting of serious and potentially life-threatening illness.Greater than 50%  of this time was spent counseling and coordinating care related to the above assessment and plan.  Willette Alma, AGPCNP-BC  Palliative Medicine Team/[facility]  *Please note that this is a verbal dictation therefore any spelling or grammatical errors are due to the "Dragon Medical One" system interpretation.

## 2023-06-09 ENCOUNTER — Ambulatory Visit (HOSPITAL_COMMUNITY)
Admission: RE | Admit: 2023-06-09 | Discharge: 2023-06-09 | Disposition: A | Discharge status: Home / Self Care | Payer: Medicare Other | Source: Ambulatory Visit | Attending: Nurse Practitioner | Admitting: Nurse Practitioner

## 2023-06-09 ENCOUNTER — Other Ambulatory Visit: Payer: Self-pay | Admitting: Nurse Practitioner

## 2023-06-09 DIAGNOSIS — C569 Malignant neoplasm of unspecified ovary: Secondary | ICD-10-CM | POA: Insufficient documentation

## 2023-06-09 DIAGNOSIS — R530 Neoplastic (malignant) related fatigue: Secondary | ICD-10-CM

## 2023-06-09 DIAGNOSIS — R180 Malignant ascites: Secondary | ICD-10-CM

## 2023-06-09 DIAGNOSIS — Z515 Encounter for palliative care: Secondary | ICD-10-CM

## 2023-06-09 DIAGNOSIS — Z7189 Other specified counseling: Secondary | ICD-10-CM

## 2023-06-09 MED ORDER — LIDOCAINE HCL 1 % IJ SOLN
INTRAMUSCULAR | Status: AC
  Filled 2023-06-09: qty 20

## 2023-06-10 MED ORDER — AMLODIPINE BESYLATE 5 MG PO TABS: 5.0000 mg | ORAL_TABLET | Freq: Every day | ORAL | 0 refills | Status: DC

## 2023-06-10 NOTE — Patient Instructions (Addendum)
Continue conservative treatment at home.  May follow-up in 1 year or as needed.  Daughter has consulted with hospice regarding ovarian cancer.  We are happy to see her at any time.

## 2023-06-22 NOTE — Progress Notes (Unsigned)
Palliative Medicine [facility]  Telephone:(336) [phone] Fax:(336) [phone]   Name: Amber Chang Date: 06/22/2023 MRN: [id number]  DOB: 1927/04/30  Patient Care Team: Margaree Mackintosh, MD as PCP - General (Internal Medicine)  I connected with Amber Chang on 06/23/23 at  1:15 PM EDT by phone and verified that I am speaking with the correct person using two identifiers.   I discussed the limitations, risks, security and privacy concerns of performing an evaluation and management service by telemedicine and the availability of in-person appointments. I also discussed with the patient that there may be a patient responsible charge related to this service. The patient expressed understanding and agreed to proceed.   Other persons participating in the visit and their role in the encounter: Daughter, Amber Chang   Patient's location: Home   Provider's location: [facility]    INTERVAL HISTORY: Amber Chang is a 89 y.o. female with oncologic medical history including stage I ovarian cancer, s/p therapeutic cystic aspiration, CVA, hyperlipidemia, hypertension, heart failure, memory loss.  Palliative ask to see for symptom management and goals of care.   SOCIAL HISTORY:     reports that she has never smoked. She has never used smokeless tobacco. She reports that she does not currently use alcohol. She reports that she does not use drugs.  ADVANCE DIRECTIVES:  MOST, DNR, and Advanced Directives on file  CODE STATUS: DNR  PAST MEDICAL HISTORY: Past Medical History:  Diagnosis Date   Diabetes mellitus without complication (HCC)    Glaucoma    Hyperlipidemia    Hypertension     ALLERGIES:  is allergic to penicillins.  MEDICATIONS:  Current Outpatient Medications  Medication Sig Dispense Refill   acetaminophen (TYLENOL) 500 MG tablet Take 500 mg by mouth every 6 (six) hours as needed for moderate pain.     albuterol (VENTOLIN HFA) 108 (90 Base) MCG/ACT  inhaler Inhale 2 puffs into the lungs every 6 (six) hours as needed for wheezing or shortness of breath. 6.7 g PRN   amLODipine (NORVASC) 5 MG tablet Take 1 tablet (5 mg total) by mouth daily. 90 tablet 0   aspirin EC 81 MG tablet Take 81 mg by mouth daily. Swallow whole.     azithromycin (ZITHROMAX) 200 MG/5ML suspension 2 teaspoon by mouth day 1 followed by one teaspoon days 2-5 30 mL 0   benzonatate (TESSALON) 100 MG capsule TAKE 1 CAPSULE(100 MG) BY MOUTH TWICE DAILY AS NEEDED FOR COUGH 60 capsule 5   cephALEXin (KEFLEX) 250 MG capsule Take 1 capsule (250 mg total) by mouth 3 (three) times daily. 21 capsule 0   cetirizine (ZYRTEC) 10 MG tablet Take 10 mg by mouth daily.     clopidogrel (PLAVIX) 75 MG tablet TAKE 1 TABLET(75 MG) BY MOUTH DAILY 90 tablet 3   donepezil (ARICEPT) 5 MG tablet TAKE 1 TABLET(5 MG) BY MOUTH AT BEDTIME (Patient taking differently: Take 5 mg by mouth at bedtime.) 90 tablet 3   latanoprost (XALATAN) 0.005 % ophthalmic solution Place 1 drop into both eyes at bedtime.  0   losartan (COZAAR) 100 MG tablet Take 1 tablet (100 mg total) by mouth daily. 90 tablet 3   metFORMIN (GLUCOPHAGE) 500 MG tablet TAKE 1 TABLET(500 MG) BY MOUTH TWICE DAILY 180 tablet 3   metoprolol succinate (TOPROL-XL) 100 MG 24 hr tablet TAKE 1 TABLET BY MOUTH EVERY DAY WITH OR IMMEDIATELY FOLLOWING A MEAL 60 tablet 5   polyethylene glycol (MIRALAX /  GLYCOLAX) 17 g packet Take 17 g by mouth daily.     predniSONE (DELTASONE) 10 MG tablet Take 4 tabs on day 1, 3 tabs on day 2, 2 tabs on day 3 and 1 tab on day 4. 10 tablet 0   rosuvastatin (CRESTOR) 20 MG tablet TAKE 1 TABLET(20 MG) BY MOUTH DAILY 90 tablet 3   Skin Protectants, Misc. (ENDIT EX) Apply 1 Application topically daily.     thioridazine (MELLARIL) 10 MG tablet Take 1 tablet (10 mg total) by mouth 3 (three) times daily as needed for anxiety. 30 tablet 0   No current facility-administered medications for this visit.    VITAL SIGNS: There were  no vitals taken for this visit. There were no vitals filed for this visit.  Estimated body mass index is 19.45 kg/m as calculated from the following:   Height as of 04/20/23: 4' 11.06" (1.5 m).   Weight as of 05/26/23: 96 lb 8 oz (43.8 kg).   PERFORMANCE STATUS (ECOG) : 1 - Symptomatic but completely ambulatory  IMPRESSION: I connected with patient's daughter, Amber Chang by phone for follow-up. Amber Chang present. Amber Chang shares her mother is not having the best day today. Continues to show signs of worsening dementia and decline. Having some difficulty processing commands and ambulating today. Daughter states she is leaving her in the bed today and turning her for care to prevent falls. Appetite continues to fluctuates. Some days are better than others. This is also similar for her medication use as well.  Patient will tend to spit out medications or act as if she is unable to swallow at times with certain pills.  I again provided extensive education regarding dementia and disease trajectory as well as her cancer diagnosis.  Daughter verbalized understanding and appreciation.  I empathetically approach discussions regarding goals of care and hospice perforation.  Amber Chang verbalized understanding stating she feels that her mother is becoming more hospice appropriate however would like additional time given some days are better than others making her second guess if that is an appropriate decision.  I recommended considering patient's overall quality of life and analyzing what the majority of her days look like.  She verbalized understanding and appreciation expressing this provides her better comfort and her decisions.  Daughter is requesting a few weeks to continue to closely assess her mother's overall condition.  She verbalizes if patient was to further decline her plans would be to then contact our office and pursue hospice at that time or if no significant improvement or "good" days then she would  be at peace with this decision.  All questions answered and support provided.  Will plan to follow-up in 2 weeks.  Daughter knows to contact office sooner if needed.  I discussed the importance of continued conversation with family and their medical providers regarding overall plan of care and treatment options, ensuring decisions are within the context of the patients values and GOCs.  PLAN:  Goals remain clear, patient and daughter wishes to treat the treatable however with no aggressive interventions. Their main focus is on patient's comfort and quality of life.  Education provided on outpatient hospice and inpatient hospice unit as patient began to show signs of decline and end-of-life.   Family is taking things one day at a time. DNR/DNI and MOST form were completed on 11/12/22.  Ongoing support as needed.  Education provided on comfort focused meals. I will plan to see patient back in 2-3 weeks in collaboration to other  oncology appointments.  Daughter knows to contact office sooner if needed.   Patient expressed understanding and was in agreement with this plan. She also understands that She can call the clinic at any time with any questions, concerns, or complaints.    Visit consisted of counseling and education dealing with the complex and emotionally intense issues of symptom management and palliative care in the setting of serious and potentially life-threatening illness.Greater than 50%  of this time was spent counseling and coordinating care related to the above assessment and plan.  Willette Alma, AGPCNP-BC  Palliative Medicine Team/[facility]  *Please note that this is a verbal dictation therefore any spelling or grammatical errors are due to the "Dragon Medical One" system interpretation.

## 2023-06-23 ENCOUNTER — Inpatient Hospital Stay: Payer: Medicare Other | Attending: Psychiatry | Admitting: Nurse Practitioner

## 2023-06-23 ENCOUNTER — Encounter: Payer: Self-pay | Admitting: Nurse Practitioner

## 2023-06-23 DIAGNOSIS — C569 Malignant neoplasm of unspecified ovary: Secondary | ICD-10-CM

## 2023-06-23 DIAGNOSIS — R530 Neoplastic (malignant) related fatigue: Secondary | ICD-10-CM | POA: Diagnosis not present

## 2023-06-23 DIAGNOSIS — Z515 Encounter for palliative care: Secondary | ICD-10-CM

## 2023-06-23 DIAGNOSIS — Z7189 Other specified counseling: Secondary | ICD-10-CM | POA: Diagnosis not present

## 2023-07-07 ENCOUNTER — Inpatient Hospital Stay: Payer: Medicare Other | Admitting: Nurse Practitioner

## 2023-07-07 NOTE — Progress Notes (Unsigned)
Palliative Medicine [facility]  Telephone:(336) [phone] [phone]) [phone]   Name: Amber Chang Date: 07/07/2023 MRN: [id number]  DOB: Jan 09, 1927  Patient Care Team: Margaree Mackintosh, MD as PCP - General (Internal Medicine)  I connected with Lake Bells on 07/07/23 at  2:30 PM EDT by phone and verified that I am speaking with the correct person using two identifiers.   I discussed the limitations, risks, security and privacy concerns of performing an evaluation and management service by telemedicine and the availability of in-person appointments. I also discussed with the patient that there may be a patient responsible charge related to this service. The patient expressed understanding and agreed to proceed.   Other persons participating in the visit and their role in the encounter: Daughter, Wray Kearns   Patient's location: Home   Provider's location: [facility]    INTERVAL HISTORY: Amber Chang is a 89 y.o. female with oncologic medical history including stage I ovarian cancer, s/p therapeutic cystic aspiration, CVA, hyperlipidemia, hypertension, heart failure, memory loss.  Palliative ask to see for symptom management and goals of care.   SOCIAL HISTORY:     reports that she has never smoked. She has never used smokeless tobacco. She reports that she does not currently use alcohol. She reports that she does not use drugs.  ADVANCE DIRECTIVES:  MOST, DNR, and Advanced Directives on file  CODE STATUS: DNR  PAST MEDICAL HISTORY: Past Medical History:  Diagnosis Date   Diabetes mellitus without complication (HCC)    Glaucoma    Hyperlipidemia    Hypertension     ALLERGIES:  is allergic to penicillins.  MEDICATIONS:  Current Outpatient Medications  Medication Sig Dispense Refill   acetaminophen (TYLENOL) 500 MG tablet Take 500 mg by mouth every 6 (six) hours as needed for moderate pain.     albuterol (VENTOLIN HFA) 108 (90 Base) MCG/ACT  inhaler Inhale 2 puffs into the lungs every 6 (six) hours as needed for wheezing or shortness of breath. 6.7 g PRN   amLODipine (NORVASC) 5 MG tablet Take 1 tablet (5 mg total) by mouth daily. 90 tablet 0   aspirin EC 81 MG tablet Take 81 mg by mouth daily. Swallow whole.     azithromycin (ZITHROMAX) 200 MG/5ML suspension 2 teaspoon by mouth day 1 followed by one teaspoon days 2-5 30 mL 0   benzonatate (TESSALON) 100 MG capsule TAKE 1 CAPSULE(100 MG) BY MOUTH TWICE DAILY AS NEEDED FOR COUGH 60 capsule 5   cephALEXin (KEFLEX) 250 MG capsule Take 1 capsule (250 mg total) by mouth 3 (three) times daily. 21 capsule 0   cetirizine (ZYRTEC) 10 MG tablet Take 10 mg by mouth daily.     clopidogrel (PLAVIX) 75 MG tablet TAKE 1 TABLET(75 MG) BY MOUTH DAILY 90 tablet 3   donepezil (ARICEPT) 5 MG tablet TAKE 1 TABLET(5 MG) BY MOUTH AT BEDTIME (Patient taking differently: Take 5 mg by mouth at bedtime.) 90 tablet 3   latanoprost (XALATAN) 0.005 % ophthalmic solution Place 1 drop into both eyes at bedtime.  0   losartan (COZAAR) 100 MG tablet Take 1 tablet (100 mg total) by mouth daily. 90 tablet 3   metFORMIN (GLUCOPHAGE) 500 MG tablet TAKE 1 TABLET(500 MG) BY MOUTH TWICE DAILY 180 tablet 3   metoprolol succinate (TOPROL-XL) 100 MG 24 hr tablet TAKE 1 TABLET BY MOUTH EVERY DAY WITH OR IMMEDIATELY FOLLOWING A MEAL 60 tablet 5   polyethylene glycol (MIRALAX /  GLYCOLAX) 17 g packet Take 17 g by mouth daily.     predniSONE (DELTASONE) 10 MG tablet Take 4 tabs on day 1, 3 tabs on day 2, 2 tabs on day 3 and 1 tab on day 4. 10 tablet 0   rosuvastatin (CRESTOR) 20 MG tablet TAKE 1 TABLET(20 MG) BY MOUTH DAILY 90 tablet 3   Skin Protectants, Misc. (ENDIT EX) Apply 1 Application topically daily.     thioridazine (MELLARIL) 10 MG tablet Take 1 tablet (10 mg total) by mouth 3 (three) times daily as needed for anxiety. 30 tablet 0   No current facility-administered medications for this visit.    VITAL SIGNS: There were  no vitals taken for this visit. There were no vitals filed for this visit.  Estimated body mass index is 19.45 kg/m as calculated from the following:   Height as of 04/20/23: 4' 11.06" (1.5 m).   Weight as of 05/26/23: 96 lb 8 oz (43.8 kg).   PERFORMANCE STATUS (ECOG) : 1 - Symptomatic but completely ambulatory  IMPRESSION:   I discussed the importance of continued conversation with family and their medical providers regarding overall plan of care and treatment options, ensuring decisions are within the context of the patients values and GOCs.  PLAN:  Goals remain clear, patient and daughter wishes to treat the treatable however with no aggressive interventions. Their main focus is on patient's comfort and quality of life.  Education provided on outpatient hospice and inpatient hospice unit as patient began to show signs of decline and end-of-life.   Family is taking things one day at a time. DNR/DNI and MOST form were completed on 11/12/22.  Ongoing support as needed.  Education provided on comfort focused meals. I will plan to see patient back in 2-3 weeks in collaboration to other oncology appointments.  Daughter knows to contact office sooner if needed.   Patient expressed understanding and was in agreement with this plan. She also understands that She can call the clinic at any time with any questions, concerns, or complaints.    Visit consisted of counseling and education dealing with the complex and emotionally intense issues of symptom management and palliative care in the setting of serious and potentially life-threatening illness.Greater than 50%  of this time was spent counseling and coordinating care related to the above assessment and plan.  Willette Alma, AGPCNP-BC  Palliative Medicine Team/[facility]  *Please note that this is a verbal dictation therefore any spelling or grammatical errors are due to the "Dragon Medical One" system  interpretation.

## 2023-07-08 ENCOUNTER — Encounter: Payer: Self-pay | Admitting: Nurse Practitioner

## 2023-07-08 ENCOUNTER — Inpatient Hospital Stay (HOSPITAL_BASED_OUTPATIENT_CLINIC_OR_DEPARTMENT_OTHER): Payer: Medicare Other | Admitting: Nurse Practitioner

## 2023-07-08 DIAGNOSIS — C569 Malignant neoplasm of unspecified ovary: Secondary | ICD-10-CM

## 2023-07-08 DIAGNOSIS — Z7189 Other specified counseling: Secondary | ICD-10-CM

## 2023-07-08 DIAGNOSIS — G893 Neoplasm related pain (acute) (chronic): Secondary | ICD-10-CM

## 2023-07-08 DIAGNOSIS — Z515 Encounter for palliative care: Secondary | ICD-10-CM

## 2023-07-08 NOTE — Progress Notes (Signed)
Palliative Medicine [facility]  [phone] [phone]   Name: Amber Chang Date: 07/08/2023 MRN: [id number]  DOB: 04/11/1927  Patient Care Team: Margaree Mackintosh, MD as PCP - General (Internal Medicine)  I connected with Amber Chang on 07/08/23 at 12:30 PM EDT by phone and verified that I am speaking with the correct person using two identifiers.   I discussed the limitations, risks, security and privacy concerns of performing an evaluation and management service by telemedicine and the availability of in-person appointments. I also discussed with the patient that there may be a patient responsible charge related to this service. The patient expressed understanding and agreed to proceed.   Other persons participating in the visit and their role in the encounter: Daughter, Amber Chang   Patient's location: Home   Provider's location: [facility]    INTERVAL HISTORY: Amber Chang is a 89 y.o. female with oncologic medical history including stage I ovarian cancer, s/p therapeutic cystic aspiration, CVA, hyperlipidemia, hypertension, heart failure, memory loss.  Palliative ask to see for symptom management and goals of care.   SOCIAL HISTORY:     reports that she has never smoked. She has never used smokeless tobacco. She reports that she does not currently use alcohol. She reports that she does not use drugs.  ADVANCE DIRECTIVES:  MOST, DNR, and Advanced Directives on file  CODE STATUS: DNR  PAST MEDICAL HISTORY: Past Medical History:  Diagnosis Date   Diabetes mellitus without complication (HCC)    Glaucoma    Hyperlipidemia    Hypertension     ALLERGIES:  is allergic to penicillins.  MEDICATIONS:  Current Outpatient Medications  Medication Sig Dispense Refill   acetaminophen (TYLENOL) 500 MG tablet Take 500 mg by mouth every 6 (six) hours as needed for moderate pain.     albuterol (VENTOLIN HFA) 108 (90 Base) MCG/ACT  inhaler Inhale 2 puffs into the lungs every 6 (six) hours as needed for wheezing or shortness of breath. 6.7 g PRN   amLODipine (NORVASC) 5 MG tablet Take 1 tablet (5 mg total) by mouth daily. 90 tablet 0   aspirin EC 81 MG tablet Take 81 mg by mouth daily. Swallow whole.     azithromycin (ZITHROMAX) 200 MG/5ML suspension 2 teaspoon by mouth day 1 followed by one teaspoon days 2-5 30 mL 0   benzonatate (TESSALON) 100 MG capsule TAKE 1 CAPSULE(100 MG) BY MOUTH TWICE DAILY AS NEEDED FOR COUGH 60 capsule 5   cephALEXin (KEFLEX) 250 MG capsule Take 1 capsule (250 mg total) by mouth 3 (three) times daily. 21 capsule 0   cetirizine (ZYRTEC) 10 MG tablet Take 10 mg by mouth daily.     clopidogrel (PLAVIX) 75 MG tablet TAKE 1 TABLET(75 MG) BY MOUTH DAILY 90 tablet 3   donepezil (ARICEPT) 5 MG tablet TAKE 1 TABLET(5 MG) BY MOUTH AT BEDTIME (Patient taking differently: Take 5 mg by mouth at bedtime.) 90 tablet 3   latanoprost (XALATAN) 0.005 % ophthalmic solution Place 1 drop into both eyes at bedtime.  0   losartan (COZAAR) 100 MG tablet Take 1 tablet (100 mg total) by mouth daily. 90 tablet 3   metFORMIN (GLUCOPHAGE) 500 MG tablet TAKE 1 TABLET(500 MG) BY MOUTH TWICE DAILY 180 tablet 3   metoprolol succinate (TOPROL-XL) 100 MG 24 hr tablet TAKE 1 TABLET BY MOUTH EVERY DAY WITH OR IMMEDIATELY FOLLOWING A MEAL 60 tablet 5   polyethylene glycol (MIRALAX /  GLYCOLAX) 17 g packet Take 17 g by mouth daily.     predniSONE (DELTASONE) 10 MG tablet Take 4 tabs on day 1, 3 tabs on day 2, 2 tabs on day 3 and 1 tab on day 4. 10 tablet 0   rosuvastatin (CRESTOR) 20 MG tablet TAKE 1 TABLET(20 MG) BY MOUTH DAILY 90 tablet 3   Skin Protectants, Misc. (ENDIT EX) Apply 1 Application topically daily.     thioridazine (MELLARIL) 10 MG tablet Take 1 tablet (10 mg total) by mouth 3 (three) times daily as needed for anxiety. 30 tablet 0   No current facility-administered medications for this visit.    VITAL SIGNS: There were  no vitals taken for this visit. There were no vitals filed for this visit.  Estimated body mass index is 19.45 kg/m as calculated from the following:   Height as of 04/20/23: 4' 11.06" (1.5 m).   Weight as of 05/26/23: 96 lb 8 oz (43.8 kg).   PERFORMANCE STATUS (ECOG) : 1 - Symptomatic but completely ambulatory  IMPRESSION:  I connected with Amber Chang by phone for follow-up. She identified no acute distress with Amber Chang. States overall things remain stable. Some days remain better than others. States patient's appetite fluctuates. She may eat several good meals a week.   Reports Amber Chang has occasionally woken up and expressed some concerns for abdominal pain. This has been controlled with Tylenol however there has been a few times that she felt pain returned despite use of Tylenol. Education provided on verbal and nonverbal signs of pain. She knows to contact office if this continues to occur as we can discuss other options to manage pain.   Her sister states she is starting to sleep a little more often during the day. She continues to that things one day at a time. She is not quite ready to consider hospice but knows this is an option and will be most appropriate in the near future. Continues to express appreciation of ongoing support.   I discussed the importance of continued conversation with family and their medical providers regarding overall plan of care and treatment options, ensuring decisions are within the context of the patients values and GOCs.  PLAN:  Goals remain clear, patient and daughter wishes to treat the treatable however with no aggressive interventions. Their main focus is on patient's comfort and quality of life.  Education provided on outpatient hospice and inpatient hospice unit as patient began to show signs of decline and end-of-life.   Family is taking things one day at a time. DNR/DNI and MOST form were completed on 11/12/22.  Ongoing support as needed.   Education provided on comfort focused meals. I will plan to see patient back in 2-3 weeks in collaboration to other oncology appointments.  Daughter knows to contact office sooner if needed.   Patient expressed understanding and was in agreement with this plan. She also understands that She can call the clinic at any time with any questions, concerns, or complaints.    Visit consisted of counseling and education dealing with the complex and emotionally intense issues of symptom management and palliative care in the setting of serious and potentially life-threatening illness.Greater than 50%  of this time was spent counseling and coordinating care related to the above assessment and plan.  Willette Alma, AGPCNP-BC  Palliative Medicine Team/[facility]  *Please note that this is a verbal dictation therefore any spelling or grammatical errors are due to the "Dragon Medical  One" system interpretation.

## 2023-07-16 ENCOUNTER — Emergency Department (HOSPITAL_COMMUNITY)
Admission: EM | Admit: 2023-07-16 | Discharge: 2023-07-16 | Disposition: A | Discharge status: Home / Self Care | Payer: Medicare Other | Attending: Student | Admitting: Student

## 2023-07-16 ENCOUNTER — Other Ambulatory Visit: Payer: Self-pay

## 2023-07-16 ENCOUNTER — Encounter (HOSPITAL_COMMUNITY): Payer: Self-pay | Admitting: Emergency Medicine

## 2023-07-16 DIAGNOSIS — M79604 Pain in right leg: Secondary | ICD-10-CM | POA: Insufficient documentation

## 2023-07-16 DIAGNOSIS — E119 Type 2 diabetes mellitus without complications: Secondary | ICD-10-CM | POA: Insufficient documentation

## 2023-07-16 DIAGNOSIS — I10 Essential (primary) hypertension: Secondary | ICD-10-CM | POA: Diagnosis not present

## 2023-07-16 DIAGNOSIS — Z8543 Personal history of malignant neoplasm of ovary: Secondary | ICD-10-CM | POA: Diagnosis not present

## 2023-07-16 DIAGNOSIS — Z79899 Other long term (current) drug therapy: Secondary | ICD-10-CM | POA: Insufficient documentation

## 2023-07-16 DIAGNOSIS — Z7982 Long term (current) use of aspirin: Secondary | ICD-10-CM | POA: Insufficient documentation

## 2023-07-16 DIAGNOSIS — R531 Weakness: Secondary | ICD-10-CM | POA: Diagnosis present

## 2023-07-16 DIAGNOSIS — Z7984 Long term (current) use of oral hypoglycemic drugs: Secondary | ICD-10-CM | POA: Insufficient documentation

## 2023-07-16 DIAGNOSIS — M79605 Pain in left leg: Secondary | ICD-10-CM | POA: Insufficient documentation

## 2023-07-16 LAB — CBC WITH DIFFERENTIAL/PLATELET
Abs Immature Granulocytes: 0.03 10*3/uL (ref 0.00–0.07)
Basophils Absolute: 0 10*3/uL (ref 0.0–0.1)
Basophils Relative: 0 %
Eosinophils Absolute: 0.1 10*3/uL (ref 0.0–0.5)
Eosinophils Relative: 1 %
HCT: 33.8 % — ABNORMAL LOW (ref 36.0–46.0)
Hemoglobin: 10.7 g/dL — ABNORMAL LOW (ref 12.0–15.0)
Immature Granulocytes: 0 %
Lymphocytes Relative: 12 %
Lymphs Abs: 1.1 10*3/uL (ref 0.7–4.0)
MCH: 31.5 pg (ref 26.0–34.0)
MCHC: 31.7 g/dL (ref 30.0–36.0)
MCV: 99.4 fL (ref 80.0–100.0)
Monocytes Absolute: 0.7 10*3/uL (ref 0.1–1.0)
Monocytes Relative: 8 %
Neutro Abs: 7.6 10*3/uL (ref 1.7–7.7)
Neutrophils Relative %: 79 %
Platelets: 229 10*3/uL (ref 150–400)
RBC: 3.4 MIL/uL — ABNORMAL LOW (ref 3.87–5.11)
RDW: 12.2 % (ref 11.5–15.5)
WBC: 9.5 10*3/uL (ref 4.0–10.5)
nRBC: 0 % (ref 0.0–0.2)

## 2023-07-16 LAB — COMPREHENSIVE METABOLIC PANEL
ALT: 15 U/L (ref 0–44)
AST: 16 U/L (ref 15–41)
Albumin: 3.6 g/dL (ref 3.5–5.0)
Alkaline Phosphatase: 59 U/L (ref 38–126)
Anion gap: 11 (ref 5–15)
BUN: 17 mg/dL (ref 8–23)
CO2: 23 mmol/L (ref 22–32)
Calcium: 9.3 mg/dL (ref 8.9–10.3)
Chloride: 105 mmol/L (ref 98–111)
Creatinine, Ser: 1.3 mg/dL — ABNORMAL HIGH (ref 0.44–1.00)
GFR, Estimated: 38 mL/min — ABNORMAL LOW (ref >60)
Glucose, Bld: 101 mg/dL — ABNORMAL HIGH (ref 70–99)
Potassium: 2.9 mmol/L — ABNORMAL LOW (ref 3.5–5.1)
Sodium: 139 mmol/L (ref 135–145)
Total Bilirubin: 1 mg/dL (ref 0.3–1.2)
Total Protein: 6.5 g/dL (ref 6.5–8.1)

## 2023-07-16 NOTE — ED Provider Notes (Signed)
[place] EMERGENCY DEPARTMENT AT [facility] Provider Note  CSN: [id number] Arrival date & time: 07/16/23 1032  Chief Complaint(s) Leg Pain  HPI Amber Chang is a 89 y.o. female with PMH ovarian cancer currently on hospice comfort care, T2DM, HTN, HLD, dementia who presents emergency department for evaluation of generalized weakness.  Patient lives at home with daughter has been getting progressively weaker.  Has slid out of her chair 3 times in the last 2 days.  No associated head trauma or loss of consciousness.  Daughter endorses patient complaining of bilateral leg pain.  Additional history unable to be obtained from patient given her underlying dementia.   Past Medical History Past Medical History:  Diagnosis Date   Diabetes mellitus without complication (HCC)    Glaucoma    Hyperlipidemia    Hypertension    Patient Active Problem List   Diagnosis Date Noted   Malignant neoplasm of ovary (HCC) 05/03/2023   Pain due to onychomycosis of toenails of both feet 06/14/2021   Essential hypertension 12/09/2015   Diabetes mellitus without complication (HCC) 12/09/2015   Glaucoma 12/09/2015   Hyperlipidemia 12/09/2015   Home Medication(s) Prior to Admission medications   Medication Sig Start Date End Date Taking? Authorizing Provider  acetaminophen (TYLENOL) 500 MG tablet Take 500 mg by mouth every 6 (six) hours as needed for moderate pain.    [provider]  albuterol (VENTOLIN HFA) 108 (90 Base) MCG/ACT inhaler Inhale 2 puffs into the lungs every 6 (six) hours as needed for wheezing or shortness of breath. 11/25/22   Margaree Mackintosh, MD  amLODipine (NORVASC) 5 MG tablet Take 1 tablet (5 mg total) by mouth daily. 06/10/23   Margaree Mackintosh, MD  aspirin EC 81 MG tablet Take 81 mg by mouth daily. Swallow whole.    [provider]  benzonatate (TESSALON) 100 MG capsule TAKE 1 CAPSULE(100 MG) BY MOUTH TWICE DAILY AS NEEDED FOR COUGH 11/21/22   Margaree Mackintosh,  MD  cetirizine (ZYRTEC) 10 MG tablet Take 10 mg by mouth daily.    [provider]  clopidogrel (PLAVIX) 75 MG tablet TAKE 1 TABLET(75 MG) BY MOUTH DAILY 01/13/23   Margaree Mackintosh, MD  donepezil (ARICEPT) 5 MG tablet TAKE 1 TABLET(5 MG) BY MOUTH AT BEDTIME Patient taking differently: Take 5 mg by mouth at bedtime. 08/02/22   Margaree Mackintosh, MD  latanoprost (XALATAN) 0.005 % ophthalmic solution Place 1 drop into both eyes at bedtime. 09/17/15   [provider]  losartan (COZAAR) 100 MG tablet Take 1 tablet (100 mg total) by mouth daily. 08/18/22   Margaree Mackintosh, MD  metFORMIN (GLUCOPHAGE) 500 MG tablet TAKE 1 TABLET(500 MG) BY MOUTH TWICE DAILY 07/17/22   Margaree Mackintosh, MD  metoprolol succinate (TOPROL-XL) 100 MG 24 hr tablet TAKE 1 TABLET BY MOUTH EVERY DAY WITH OR IMMEDIATELY FOLLOWING A MEAL 07/17/22   Baxley, Luanna Cole, MD  polyethylene glycol (MIRALAX / GLYCOLAX) 17 g packet Take 17 g by mouth daily.    [provider]  rosuvastatin (CRESTOR) 20 MG tablet TAKE 1 TABLET(20 MG) BY MOUTH DAILY 04/13/23   Baxley, Luanna Cole, MD  Skin Protectants, Misc. (ENDIT EX) Apply 1 Application topically daily.    [provider]  thioridazine (MELLARIL) 10 MG tablet Take 1 tablet (10 mg total) by mouth 3 (three) times daily as needed for anxiety. 05/26/23   Margaree Mackintosh, MD  Past Surgical History Past Surgical History:  Procedure Laterality Date   ABDOMINAL HYSTERECTOMY     Fibroids in her 26s   CYST EXCISION     Family History Family History  Problem Relation Age of Onset   Colon cancer Brother    Breast cancer Neg Hx    Ovarian cancer Neg Hx    Endometrial cancer Neg Hx    Stomach cancer Neg Hx    Esophageal cancer Neg Hx     Social History Social History   Tobacco Use   Smoking status: Never   Smokeless tobacco: Never  Vaping Use    Vaping status: Never Used  Substance Use Topics   Alcohol use: Not Currently   Drug use: No   Allergies Penicillins  Review of Systems Review of Systems  Unable to perform ROS: Dementia    Physical Exam Vital Signs  I have reviewed the triage vital signs BP (!) 154/78   Pulse 75   Temp 98.2 F (36.8 C)   Resp 18   SpO2 95%   Physical Exam Vitals and nursing note reviewed.  Constitutional:      General: She is not in acute distress.    Appearance: She is well-developed.  HENT:     Head: Normocephalic and atraumatic.  Eyes:     Conjunctiva/sclera: Conjunctivae normal.  Cardiovascular:     Rate and Rhythm: Normal rate and regular rhythm.     Heart sounds: No murmur heard. Pulmonary:     Effort: Pulmonary effort is normal. No respiratory distress.     Breath sounds: Normal breath sounds.  Abdominal:     Palpations: Abdomen is soft.     Tenderness: There is no abdominal tenderness.  Musculoskeletal:        General: Tenderness present. No swelling.     Cervical back: Neck supple.  Skin:    General: Skin is warm and dry.     Capillary Refill: Capillary refill takes less than 2 seconds.  Neurological:     Mental Status: She is alert.  Psychiatric:        Mood and Affect: Mood normal.     ED Results and Treatments Labs (all labs ordered are listed, but only abnormal results are displayed) Labs Reviewed  COMPREHENSIVE METABOLIC PANEL - Abnormal; Notable for the following components:      Result Value   Potassium 2.9 (*)    Glucose, Bld 101 (*)    Creatinine, Ser 1.30 (*)    GFR, Estimated 38 (*)    All other components within normal limits  CBC WITH DIFFERENTIAL/PLATELET - Abnormal; Notable for the following components:   RBC 3.40 (*)    Hemoglobin 10.7 (*)    HCT 33.8 (*)    All other components within normal limits                                                                                                                          Radiology  No  results found.  Pertinent labs & imaging results that were available during my care of the patient were reviewed by me and considered in my medical decision making (see MDM for details).  Medications Ordered in ED Medications - No data to display                                                                                                                                   Procedures Procedures  (including critical care time)  Medical Decision Making / ED Course   This patient presents to the ED for concern of generalized weakness, fall, this involves an extensive number of treatment options, and is a complaint that carries with it a high risk of complications and morbidity.  The differential diagnosis includes dehydration, electrolyte abnormality, progression of underlying cancer, metastatic lesions, fracture  MDM: Patient seen emergency room for evaluation of generalized weakness and multiple falls.  Physical exam with tenderness over bilateral lower extremities and in the L-spine.  Laboratory evaluation with some mild hypokalemia to 2.9, creatinine 1.3, hemoglobin 10.7 but is otherwise unremarkable.  Patient's daughter then arrived and I received an epic message from [facility] nurse practitioner [place] who states that they will come to evaluate the patient and help with home health but the patient does not want to pursue any additional testing.  Given patient's goals of care, do suspect hospitalization would be against these and thus we did not pursue any additional invasive testing including CT.  Patient been discharged in the care of hospice.   Additional history obtained: -Additional history obtained from daughter -External records from outside source obtained and reviewed including: Chart review including previous notes, labs, imaging, consultation notes   Lab Tests: -I ordered, reviewed, and interpreted labs.   The pertinent results include:   Labs Reviewed  COMPREHENSIVE  METABOLIC PANEL - Abnormal; Notable for the following components:      Result Value   Potassium 2.9 (*)    Glucose, Bld 101 (*)    Creatinine, Ser 1.30 (*)    GFR, Estimated 38 (*)    All other components within normal limits  CBC WITH DIFFERENTIAL/PLATELET - Abnormal; Notable for the following components:   RBC 3.40 (*)    Hemoglobin 10.7 (*)    HCT 33.8 (*)    All other components within normal limits       Medicines ordered and prescription drug management: No orders of the defined types were placed in this encounter.   -I have reviewed the patients home medicines and have made adjustments as needed  Critical interventions none   Cardiac Monitoring: The patient was maintained on a cardiac monitor.  I personally viewed and interpreted the cardiac monitored which showed an underlying rhythm of: NSR  Social Determinants of Health:  Factors impacting patients care include: Currently lives at home with daughter   Reevaluation: After the interventions noted above, I reevaluated the patient and  found that they have :stayed the same  Co morbidities that complicate the patient evaluation  Past Medical History:  Diagnosis Date   Diabetes mellitus without complication (HCC)    Glaucoma    Hyperlipidemia    Hypertension       Dispostion: I considered admission for this patient, but patient's goals of care do not align with hospitalization and that she was discharged in the care of [facility]     Final Clinical Impression(s) / ED Diagnoses Final diagnoses:  Generalized weakness     @PCDICTATION @    Glendora Score, MD 07/16/23 1723

## 2023-07-16 NOTE — Discharge Planning (Signed)
Pt to return home with hospice support.  [facility] has been notified of the request and their nurse is going to connect with daughter to get her set up with home support.

## 2023-07-16 NOTE — ED Notes (Addendum)
This RN spoke with Lowella Bandy NP regarding palliative care. [facility] RN liaison to speak with patient and daughter regarding hospice care prior to discharge.

## 2023-07-16 NOTE — ED Triage Notes (Signed)
Pt BIB [facility] from home (lives with daughter) due to bilateral leg pain.  Pt reports falling three times in the last two days; [facility] reports more like sliding down.  No head trauma reports.  No LOC.  Pt does take Plavix.  Hx diabetes and dementia.  VS BP 130/70, CBG 147, SpO2 92% HR 79

## 2023-07-16 NOTE — ED Notes (Addendum)
[facility] (hospice RN) at bedside

## 2023-07-17 ENCOUNTER — Encounter: Payer: Self-pay | Admitting: Nurse Practitioner

## 2023-07-17 ENCOUNTER — Telehealth (HOSPITAL_BASED_OUTPATIENT_CLINIC_OR_DEPARTMENT_OTHER): Payer: Medicare Other | Admitting: Nurse Practitioner

## 2023-07-17 DIAGNOSIS — C569 Malignant neoplasm of unspecified ovary: Secondary | ICD-10-CM | POA: Diagnosis not present

## 2023-07-17 DIAGNOSIS — Z7189 Other specified counseling: Secondary | ICD-10-CM

## 2023-07-17 DIAGNOSIS — Z515 Encounter for palliative care: Secondary | ICD-10-CM | POA: Diagnosis not present

## 2023-07-17 NOTE — Telephone Encounter (Signed)
I connected with Lake Bells on 07/16/2023 at 1230  by telephone and verified that I am speaking with the correct person using two identifiers.   I discussed the limitations, risks, security and privacy concerns of performing an evaluation and management service by telemedicine and the availability of in-person appointments. I also discussed with the patient that there may be a patient responsible charge related to this service. The patient expressed understanding and agreed to proceed.   Other persons participating in the visit and their role in the encounter: Ms. Mickle Mallory   Patient's location: [place]  Provider's location: [facility]    Chief Complaint: Decline in condition  I received a call from patient's daughter, [name] expressing her mother continues to show signs of decline. She is emotional expressing she feels that based on our ongoing discussions it is becoming more evident that her mother is rapidly approaching end-of-life. Emotional support provided. Ms. Mickle Mallory states her mother has fallen 3 times over the past 24 hours, appetite is minimal, she is not taking her medications, sleeping more than she is awake, and is now incontinent.   Her mother is current in the ED at [facility] hospital being evaluated for injuries after multiple falls. I have reviewed all medical records. I discuss with daughter goals of care providing education on what comfort focused care would look like in the home versus hospital. [name] states she does not wish to put her mother through further interventions such as blood draws. She is ok with imaging to ensure she has not broken any bones for comfort and pain purposes however would not wish to pursue surgical interventions if this was the case. Her goal is for her mother to return home and spend what time she has left there under her care. I acknowledged her request. We have completed MOST form and DNR documents in the past. [name] confirms no changes with  focus on her mother's comfort as priority. She would like to get her home before she becomes agitated.   Education provided on hospice's goals and philosophy of care in addition to referral process. Ms. Mickle Mallory verbalized understanding. She states wishes to proceed with outpatient hospice referral identifying [facility] as choice allowing for option of inpatient unit in [place] if needed.   [place], RN notified Shawn, RN with [employer]. I have spoken with Dr. Posey Rea and Camellia, CM in ED providing updates.   Plan -Patient to be discharge home with daughter with [facility] hospice support -DNR/DNI -Daughter knows to contact office if needed. No follow up at this time.  -Prognosis 6 months or less in the setting of end stage dementia, CVA, ovarian cancer, heart failure, failure to thrive, decreased oral intake, deconditioning, falls, weight loss, DNR/DNI.   Any controlled substances utilized were prescribed in the context of palliative care. PDMP has been reviewed.    Visit consisted of counseling and education dealing with the complex and emotionally intense issues of symptom management and palliative care in the setting of serious and potentially life-threatening illness.Greater than 50%  of this time was spent counseling and coordinating care related to the above assessment and plan.  Willette Alma, AGPCNP-BC  Palliative Medicine Team/[facility]  *Please note that this is a verbal dictation therefore any spelling or grammatical errors are due to the "Dragon Medical One" system interpretation.

## 2023-07-20 ENCOUNTER — Inpatient Hospital Stay: Payer: Medicare Other | Admitting: Psychiatry

## 2023-07-20 ENCOUNTER — Telehealth: Payer: Self-pay | Admitting: *Deleted

## 2023-07-20 DIAGNOSIS — C569 Malignant neoplasm of unspecified ovary: Secondary | ICD-10-CM

## 2023-07-20 NOTE — Telephone Encounter (Signed)
Amber Chang daughter called the office and left a message stating she is calling to cancel her mothers appt today with Dr. Alvester Morin. She states patient started on Hospice on Saturday.

## 2023-07-20 NOTE — Progress Notes (Unsigned)
This encounter was created in error - please disregard.

## 2023-08-02 ENCOUNTER — Other Ambulatory Visit: Payer: Self-pay | Admitting: Internal Medicine

## 2023-08-05 ENCOUNTER — Inpatient Hospital Stay: Payer: Medicare Other

## 2023-08-05 ENCOUNTER — Ambulatory Visit: Payer: Medicare Other | Admitting: Podiatry

## 2023-08-20 ENCOUNTER — Other Ambulatory Visit: Payer: Self-pay | Admitting: Internal Medicine

## 2023-09-05 ENCOUNTER — Other Ambulatory Visit: Payer: Self-pay | Admitting: Internal Medicine

## 2023-09-05 DIAGNOSIS — E119 Type 2 diabetes mellitus without complications: Secondary | ICD-10-CM

## 2023-09-07 ENCOUNTER — Other Ambulatory Visit: Payer: Self-pay | Admitting: Internal Medicine

## 2023-10-11 DEATH — deceased

## 2024-03-28 IMAGING — CR DG CHEST 2V
2 series · 2 of 2 positions shown · non-contrast
Comparison: None Available.

CLINICAL DATA: syncope several days ago

EXAM:
CHEST - 2 VIEW

[w chest pa]
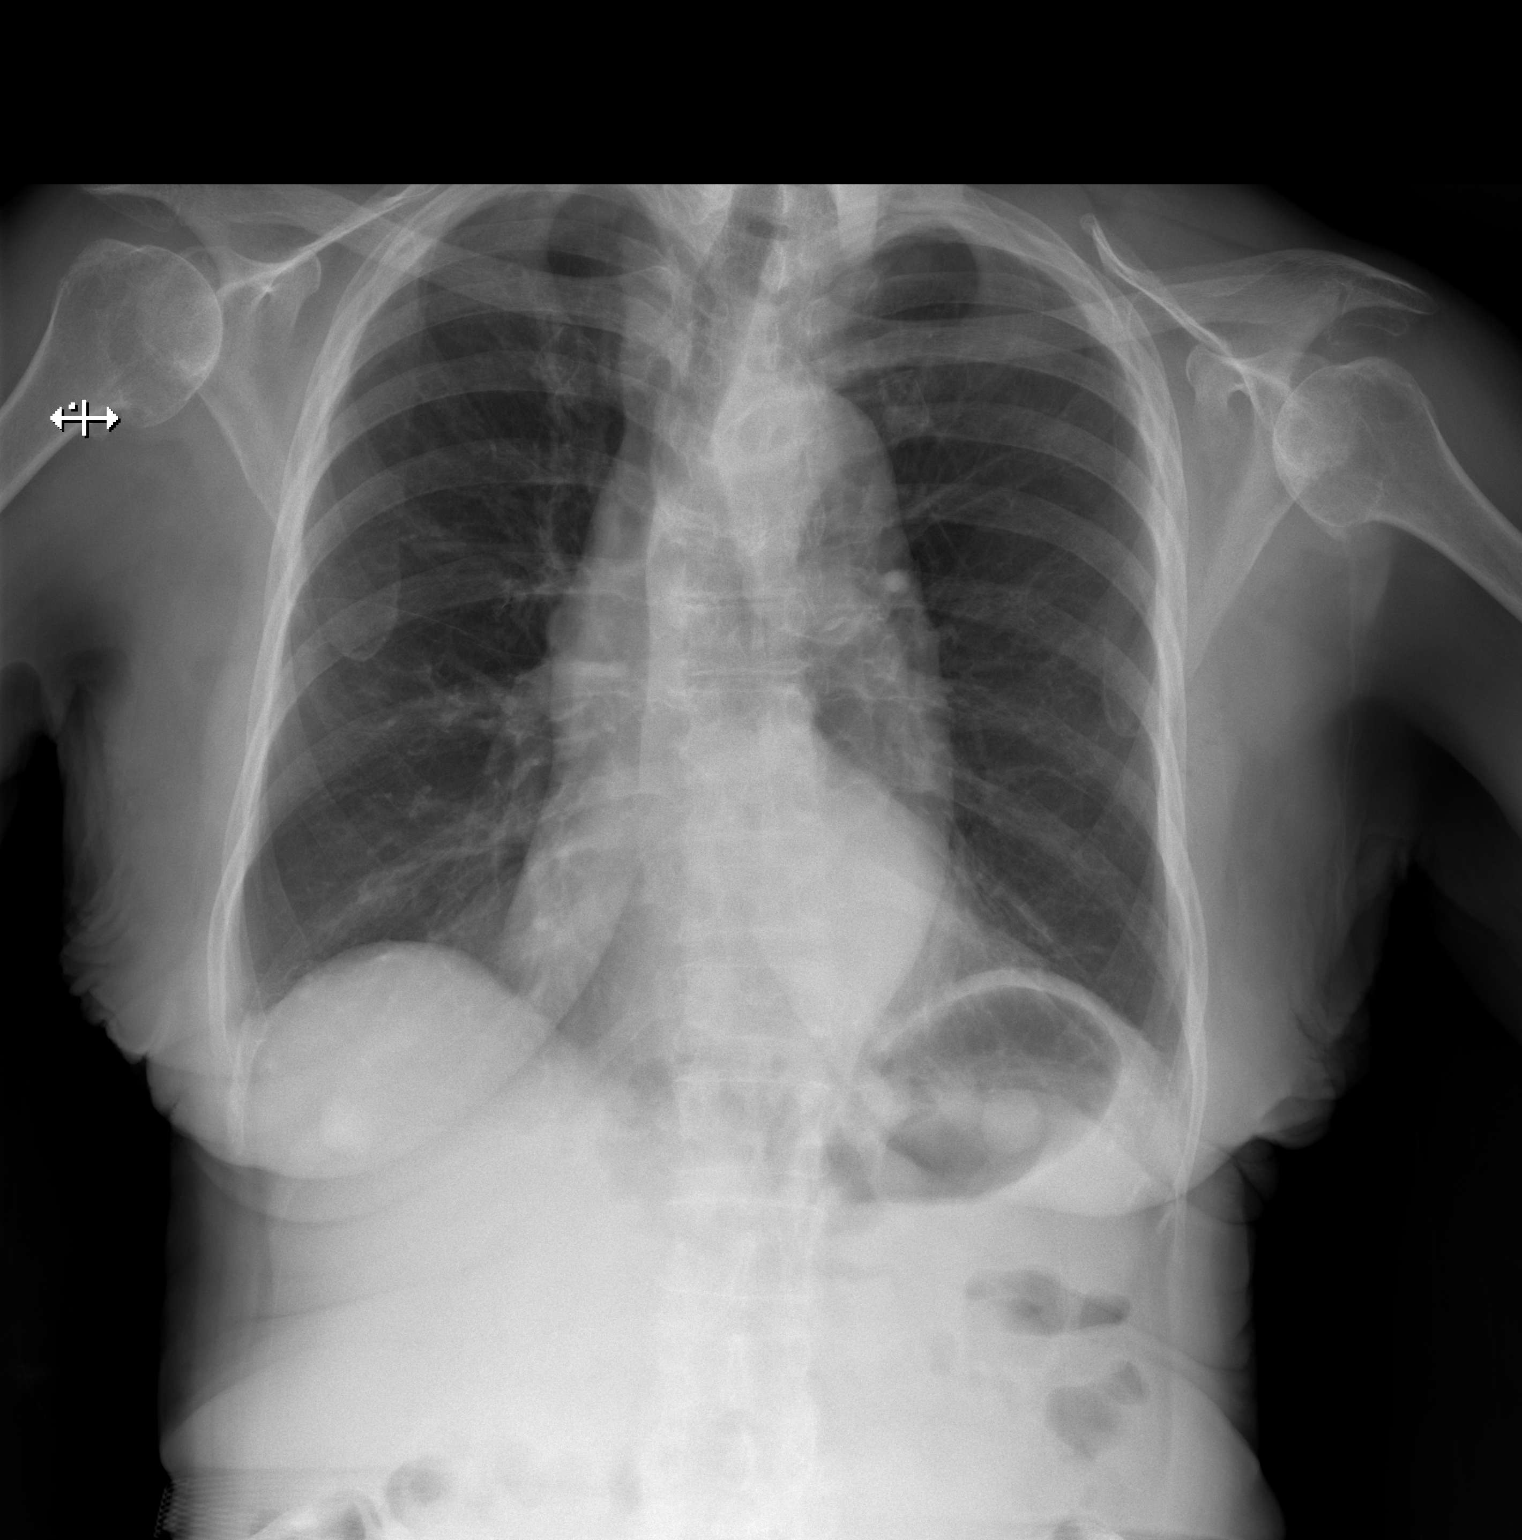

[w chest lat]
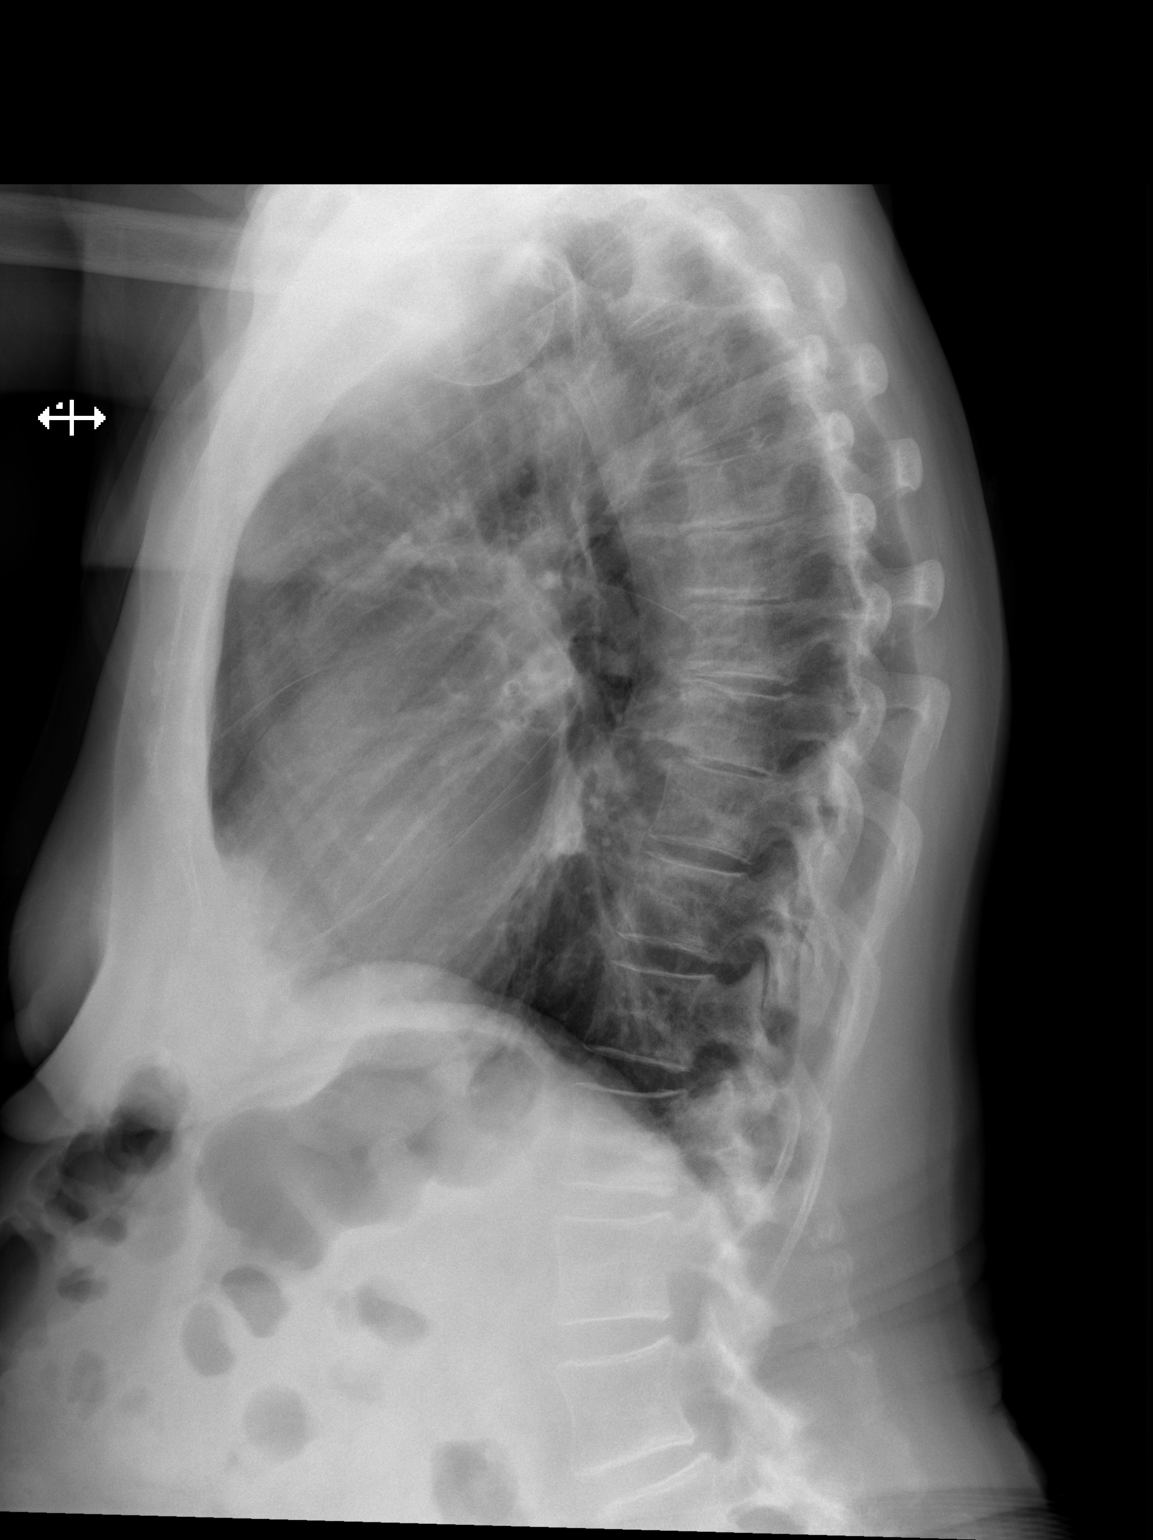

[2 of 2 positions shown; findings below may reference images not displayed]

FINDINGS: No consolidation. No visible pleural effusions or pneumothorax.
Cardiomediastinal silhouette is within normal limits. Tortuous
aorta. Polyarticular degenerative change.
IMPRESSION: No evidence of acute cardiopulmonary disease.
